# Patient Record
Sex: Female | Born: 1960 | Race: White | Hispanic: No | Smoking: Current every day smoker
Health system: Southern US, Community
[De-identification: ages and names within clinical notes are randomized; demographics above are authoritative.]

## PROBLEM LIST (undated history)

## (undated) DIAGNOSIS — I10 Essential (primary) hypertension: Secondary | ICD-10-CM

## (undated) DIAGNOSIS — I2510 Atherosclerotic heart disease of native coronary artery without angina pectoris: Secondary | ICD-10-CM

## (undated) DIAGNOSIS — F419 Anxiety disorder, unspecified: Secondary | ICD-10-CM

## (undated) DIAGNOSIS — I509 Heart failure, unspecified: Secondary | ICD-10-CM

## (undated) DIAGNOSIS — F319 Bipolar disorder, unspecified: Secondary | ICD-10-CM

## (undated) DIAGNOSIS — R011 Cardiac murmur, unspecified: Secondary | ICD-10-CM

## (undated) DIAGNOSIS — E7800 Pure hypercholesterolemia, unspecified: Secondary | ICD-10-CM

## (undated) HISTORY — PX: TUBAL LIGATION: SHX77

## (undated) HISTORY — PX: CARDIAC CATHETERIZATION: SHX172

---

## 1997-08-08 ENCOUNTER — Inpatient Hospital Stay (HOSPITAL_COMMUNITY): Admission: EM | Admit: 1997-08-08 | Discharge: 1997-08-09 | Payer: Self-pay | Admitting: Emergency Medicine

## 1998-09-28 ENCOUNTER — Emergency Department (HOSPITAL_COMMUNITY): Admission: EM | Admit: 1998-09-28 | Discharge: 1998-09-28 | Payer: Self-pay | Admitting: Emergency Medicine

## 1998-11-07 ENCOUNTER — Inpatient Hospital Stay (HOSPITAL_COMMUNITY): Admission: EM | Admit: 1998-11-07 | Discharge: 1998-11-09 | Payer: Self-pay | Admitting: Emergency Medicine

## 1998-12-07 ENCOUNTER — Inpatient Hospital Stay (HOSPITAL_COMMUNITY): Admission: EM | Admit: 1998-12-07 | Discharge: 1998-12-11 | Payer: Self-pay | Admitting: *Deleted

## 1999-04-17 ENCOUNTER — Other Ambulatory Visit: Admission: RE | Admit: 1999-04-17 | Discharge: 1999-04-17 | Payer: Self-pay | Admitting: Obstetrics and Gynecology

## 2000-09-01 ENCOUNTER — Encounter: Payer: Self-pay | Admitting: Emergency Medicine

## 2000-09-01 ENCOUNTER — Emergency Department (HOSPITAL_COMMUNITY): Admission: EM | Admit: 2000-09-01 | Discharge: 2000-09-01 | Payer: Self-pay | Admitting: Emergency Medicine

## 2000-09-04 ENCOUNTER — Emergency Department (HOSPITAL_COMMUNITY): Admission: EM | Admit: 2000-09-04 | Discharge: 2000-09-04 | Payer: Self-pay | Admitting: Emergency Medicine

## 2000-09-04 ENCOUNTER — Encounter: Payer: Self-pay | Admitting: Emergency Medicine

## 2000-09-26 ENCOUNTER — Other Ambulatory Visit: Admission: RE | Admit: 2000-09-26 | Discharge: 2000-09-26 | Payer: Self-pay | Admitting: Obstetrics and Gynecology

## 2001-10-27 ENCOUNTER — Other Ambulatory Visit: Admission: RE | Admit: 2001-10-27 | Discharge: 2001-10-27 | Payer: Self-pay | Admitting: Obstetrics and Gynecology

## 2001-11-10 ENCOUNTER — Ambulatory Visit (HOSPITAL_COMMUNITY): Admission: RE | Admit: 2001-11-10 | Discharge: 2001-11-10 | Payer: Self-pay | Admitting: Obstetrics and Gynecology

## 2001-11-10 ENCOUNTER — Encounter (INDEPENDENT_AMBULATORY_CARE_PROVIDER_SITE_OTHER): Payer: Self-pay | Admitting: Specialist

## 2002-11-23 ENCOUNTER — Inpatient Hospital Stay (HOSPITAL_COMMUNITY): Admission: RE | Admit: 2002-11-23 | Discharge: 2002-11-24 | Payer: Self-pay | Admitting: Obstetrics & Gynecology

## 2002-11-24 ENCOUNTER — Encounter: Payer: Self-pay | Admitting: *Deleted

## 2004-11-27 ENCOUNTER — Ambulatory Visit: Payer: Self-pay | Admitting: Cardiology

## 2005-11-22 ENCOUNTER — Ambulatory Visit: Payer: Self-pay | Admitting: Cardiology

## 2005-12-11 ENCOUNTER — Ambulatory Visit: Payer: Self-pay | Admitting: Cardiology

## 2007-02-12 ENCOUNTER — Inpatient Hospital Stay (HOSPITAL_COMMUNITY): Admission: EM | Admit: 2007-02-12 | Discharge: 2007-02-14 | Payer: Self-pay | Admitting: Emergency Medicine

## 2007-02-12 ENCOUNTER — Ambulatory Visit: Payer: Self-pay | Admitting: Cardiology

## 2007-02-13 ENCOUNTER — Encounter: Payer: Self-pay | Admitting: Cardiology

## 2007-02-13 ENCOUNTER — Ambulatory Visit: Payer: Self-pay | Admitting: Vascular Surgery

## 2007-02-23 ENCOUNTER — Ambulatory Visit: Payer: Self-pay | Admitting: Cardiovascular Disease

## 2007-03-02 ENCOUNTER — Ambulatory Visit: Payer: Self-pay | Admitting: Cardiovascular Disease

## 2007-03-02 ENCOUNTER — Ambulatory Visit: Payer: Self-pay | Admitting: Cardiology

## 2007-03-10 ENCOUNTER — Ambulatory Visit: Payer: Self-pay | Admitting: Cardiology

## 2007-03-12 ENCOUNTER — Ambulatory Visit: Payer: Self-pay | Admitting: Cardiovascular Disease

## 2007-03-20 ENCOUNTER — Ambulatory Visit: Payer: Self-pay | Admitting: Cardiovascular Disease

## 2008-04-08 DIAGNOSIS — I509 Heart failure, unspecified: Secondary | ICD-10-CM

## 2008-04-08 HISTORY — DX: Heart failure, unspecified: I50.9

## 2009-02-06 ENCOUNTER — Ambulatory Visit: Payer: Self-pay | Admitting: Cardiology

## 2010-08-21 NOTE — Assessment & Plan Note (Signed)
[facility]                          [facility] NOTE   NAME:[name], [name]                     MRN:          [id number]  DATE:03/10/2007                            DOB:          1960-11-24    CARDIOLOGIST:  Dr. Willa Rough.   PRIMARY CARE PHYSICIAN:  She has no primary care physician.   REASON FOR VISIT:  Post hospitalization followup.   HISTORY OF PRESENT ILLNESS:  [name] is a 50 year old female patient  who recently presented to [facility] with chest pain and near  syncope.  She underwent cardiac catheterization by Dr. Excell Seltzer which  revealed nonobstructive coronary disease.  She was noted to have a 50%  proximal LAD lesion that was quantitated at 46% by IVUS.  She also had a  30% proximal PDA lesion.  Her LV function was normal with an EF of 75-  80%.  She also had an echocardiogram that revealed an EF of 55-65%.  LV  wall thickness was moderately increased.  This was most prominent at the  apex with possible apical hypertrophic cardiomyopathy.  She had trivial  AI and mild MR and mildly pulmonary hypertension and a small pericardial  effusion.  She also underwent carotid Dopplers secondary to carotid  bruit, the carotid Dopplers were suggestive of left subclavian artery  stenosis.  She had a pressure gradient of 20 mmHg from the right to left  arm in her blood pressure.  She was enrolled in the [facility] study.  She  was also placed on Toprol and lisinopril for blood pressure, which had  been untreated, as well as aspirin.  She was subsequently discharged to  home.   Since being discharged from home the patient has followed up with Dr.  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  According to Dr. Earmon Phoenix  note, it is uncertain that her symptoms are secondary to left subclavian  steal.  The plan has been to set her up for a CT angiogram of her  aortic  arch to get a better anatomic assessment of the severity of stenosis.  The patient tells me that her left subclavian artery is 70% stenosed by  recent CT angiogram.  She is awaiting to hear back from Dr. Excell Seltzer for  further recommendations.   Since being discharged from the hospital the patient continues to have  intermittent chest pain. This is very atypical.  It is left-sided and  sharp.  It comes on at rest.  She does note it with exertion but  exertion does not make it any worse.  The pain is sometimes felt down  her left arm.  She denies associated shortness of breath, nausea or  diaphoresis.   She has also continued to have occasional dizziness.  She describes a  spinning sensation most of the time.  She was using her left arm at work  the other day.  She is a Conservation officer, nature.  With this, she became lightheaded and  felt near syncopal.  She denies any frank syncope.  She does admit to  a  spinning sensation with all of her episodes of near syncope.  She has  never been worked up for vertigo.   She continues to smoke cigarettes.  She describes dyspnea with exertion.  When questioned, she describes NYHA Class III symptoms although she does  not appear to be somebody who would have Class III symptoms.  She denies  orthopnea, PND or pedal edema.   The patient tells me today that she has also had episodes of dysphagia.  She sometimes feels as though she is going to strangle with all types of  food.  She denies odynophagia.  She has frequent dyspepsia.  She has  never been on a proton pump inhibitor.   CURRENT MEDICATIONS:  1. Toprol-XL 50 mg daily.  2. Lisinopril 20 mg daily.  3. Saturn research study drug.  4. Aspirin 81 mg daily.   ALLERGIES:  ERYTHROMYCIN.   PHYSICAL EXAM:  She is a well-nourished, well-developed female in no  distress.  Blood pressure is 126/89 in the right arm, pulse 62, weight  127.6 pounds.  HEENT:  Normal.  NECK:  Without JVD.  CARDIAC:  Normal S1  S2.  Regular rate and rhythm with a 1-2/6 systolic  ejection murmur heard best along the left lower sternal border.  LUNGS:  Clear to auscultation bilaterally without wheezing, rhonchi or  rales.  ABDOMEN:  Soft, nontender with normoactive bowel sounds.  No  organomegaly.  EXTREMITIES:  Without edema.  NEUROLOGIC:  She is alert and oriented x3.  Cranial nerves II-XII  grossly intact.  Right femoral arteriotomy site without hematoma or bruit.   Electrocardiogram reveals sinus bradycardia with a heart rate of 57,  significant LVH with repolarization abnormality.   IMPRESSION:  1. Nonobstructive coronary artery disease.  2. Good left ventricular function.  3. Hypertrophic nonobstructive cardiomyopathy.  4. Hypertension.  5. Treated dyslipidemia.      a.     Saturn study.  6. Left subclavian stenosis.      a.     Questionable history of left subclavian steal.  7. Near syncope.      a.     Question related to possible left subclavian steal.      b.     Questionable history of vertigo.  8. Gastroesophageal reflux disease.      a.     Dysphagia.  9. Ongoing tobacco abuse.      a.     Probable underlying chronic obstructive pulmonary disease.   PLAN:  Patient presents to the office today for post hospitalization  followup.  She continues to have episodes of near syncope.  She actually  describes symptoms that sound consistent with vertigo as well.  She has  also apparently been recently diagnosed with a 70% left subclavian  stenosis.  She also has significant symptoms that sound consistent with  GERD as well as dysphagia.  At this point in time will plan to:  1. Initiate Protonix 40 mg daily.  I question whether or not GERD may      be contributing to some of her chest pain.  2. Will refer her to Gastroenterology for further evaluation of GERD      and dysphagia.  3. I have recommended that she follow up with Dr. Excell Seltzer, as directed,      to further assess her left subclavian  stenosis.  4. In going forward, if her left subclavian stenosis is not felt to be      contributing to her  dizziness, she may need further evaluation for      benign positional vertigo.  We may also want to consider event      monitoring in the future as well.  In addition, she had more      prominent apical hypertrophy on her echocardiogram.  Dr. Jens Som,      who read the study, recommended an MRI if clinically indicated.  We      can also consider this in the future if thought to be necessary.  5. The patient will be brought back in followup with Dr. Myrtis Ser in the      next 8 weeks.  She knows to return sooner if needed.      Tereso Newcomer, PA-C  Electronically Signed      Learta Codding, MD,FACC  Electronically Signed   SW/MedQ  DD: 03/10/2007  DT: 03/10/2007  Job #: [id number]

## 2010-08-21 NOTE — Progress Notes (Signed)
[facility]                        PERIPHERAL VASCULAR OFFICE NOTE   NAME:[name], LYNDSAY TALAMANTE                     MRN:          [id number]  DATE:02/23/2007                            DOB:          17-Jul-1960    REASON FOR CONSULTATION:  Left subclavian stenosis with subclavian  steal.   REQUESTING PHYSICIAN:  Dr. Willa Rough   HISTORY OF PRESENT ILLNESS:  [name] is a delightful 50 year old  woman who was recently hospitalized with unstable angina.  She has had  two recent near syncopal episodes and during her hospitalization  underwent a carotid ultrasound to evaluate carotid bruits in the setting  of her recent near syncopal episodes.  Her vascular study demonstrated  abnormal brachial artery wave forms on the left and a significant 30 mm  pressure gradient from the right to the left arm with atypical left  vertebral artery flow, all suggestive of left subclavian stenosis.  She  was referred here for further evaluation of this problem.   [name] describes two near syncopal episodes. Both times she had  been standing and had not been doing any recent physical activities.  She had a prodrome of light headedness and tunnel vision and was able to  kneel to the ground and then felt better after just a few minutes. She  did not lose consciousness with either episode.  [name] also  complains of left arm pain and abnormal sensation at rest. She does not  have any exertional symptoms involving the left arm.  She specifically  denies pain, numbness or weakness of the left arm with physical  activity.  She has not had any exertional syncope.   PAST MEDICAL HISTORY IS PERTINENT FOR THE FOLLOWING:  1. Coronary artery disease. She recently underwent cardiac      catheterization for evaluation of chest pain that demonstrated      nonobstructive CAD. There was a 50% proximal LAD stenosis and a      moderate stenosis in the right PDA, the left  circumflex appeared      normal and left ventricular function was hyperdynamic.  2. Severe hypertension.  Bilateral renal arteries were widely patent      at the time of cardiac catheterization.  3. Tobacco abuse.  4. Chronic low back pain.   CURRENT MEDICATIONS:  1. Metoprolol succinate 50 mg daily.  2. Lisinopril 20 mg daily.  3. Aspirin 81 mg daily.  4. Saturn study drug.   ALLERGIES:  No known drug allergies.   FAMILY HISTORY:  The patient's mother is alive at age 5 and has  coronary artery disease and cerebrovascular disease, she has 5 siblings  with hypertension. Her father died at age 69 of a myocardial infarction.   SOCIAL HISTORY:  The patient lives in [place]. She is single. She is a  one pack per day smoker for the past 30 years, she has recently cut back  on cigarettes. She does not use alcohol or illicit drugs.   REVIEW OF SYSTEMS:  A complete 12 point review of systems was performed.  All systems were negative except for findings  as noted above.   PHYSICAL EXAMINATION:  GENERAL:  The patient is alert and oriented, she  is in no acute distress.  VITAL SIGNS:  Her weight is 129, blood pressure is 150/80 on the right,  120/90 on the left, heart rate 64, respirations 16.  HEENT:  Normal.  NECK:  There is no thyromegaly or thyroid nodules. Jugular venous  pressures normal. Carotid upstrokes are 2+, there is a loud left carotid  bruit and a quiet right carotid bruit.  LUNGS:  Clear to auscultation bilaterally.  CARDIOVASCULAR:  The apex is discreet and nondisplaced, there is no  right ventricular heave or lift. Regular rate and rhythm without murmurs  or gallops.  There is a 2/6 bruit over the left subclavian region.  ABDOMEN:  Soft, nontender, no abdominal bruits, no organomegaly.  BACK:  There is no CVA tenderness.  EXTREMITIES:  No clubbing, cyanosis or edema.  Peripheral pulses are 2+  and equal throughout.  The left brachial and radial pulses are slightly   diminished compared to the right but they are still 2+.  SKIN:  No rash.  LYMPHATICS:  No adenopathy.  NEUROLOGIC:  Cranial nerves II-XII are intact.  Strength is 5/5 and  equal in the arms and legs.   ASSESSMENT:  [name] has evidence of left subclavian stenosis.  The  big question is whether she has any symptoms due to this. I think her  symptoms are somewhat atypical as she does not have any left arm  claudication.  Her left arm pain is clearly not related to vascular  insufficiency as it is constant and most notable at rest.  She has had  two near syncopal episodes but neither was associated with use of the  left arm.  It is very difficult to know whether this is an incidental  finding or whether it truly could be related to her near syncopal  episodes.  Of note, she did have atypical flow in the left vertebral  artery.  I think it would be beneficial to perform a CT angiogram of the  aortic arch and left subclavian region to get a better anatomic  assessment of the severity of stenosis.  I plan on following up with Ms.  [name] after the results of this are available.  In the meantime she is  on an excellent medical program for her coronary and peripheral arterial  disease.  She will continue to follow with Dr. Myrtis Ser for her cardiac  disease.    Veverly Fells. Excell Seltzer, MD  Electronically Signed   MDC/MedQ  DD: 02/23/2007  DT: 02/23/2007  Job #: [id number]   cc:   [name], ANP

## 2010-08-21 NOTE — Discharge Summary (Signed)
[name], [name] NO.:  [id number]   MEDICAL RECORD NO.:  [id number]          PATIENT TYPE:  INP   LOCATION:  3733                         FACILITY:  [facility]   PHYSICIAN:  [name] Rotunda, MD, FACCDATE OF BIRTH:  27-Aug-1960   DATE OF ADMISSION:  02/12/2007  DATE OF DISCHARGE:  02/14/2007                               DISCHARGE SUMMARY   PRIMARY CARDIOLOGIST:  [name] Abed, MD, [facility]   PRIMARY CARE [name]:  The patient does not have a primary care  [name].   DISCHARGE DIAGNOSIS:  Chest pain.   SECONDARY DIAGNOSES:  1. Presyncope.  2. Hypertension.  3. ? Left arm claudication.  4. Presumed subclavian artery stenosis.  5. Ongoing tobacco abuse.  6. Chronic low back pain.  7. Hyperlipidemia.  8. Nonobstructive coronary artery disease.  9. Mild mitral regurgitation.  10.Normal left ventricular function with ejection fraction of 55% to      65% by 2D echocardiogram on this admission.   HISTORY OF PRESENT ILLNESS:  A 50 year old Caucasian female with a prior  history of recurrent chest pain with normal Cardiolite in 2007.  She  presented to the [facility] ED on February 12, 2007, with complaints of  chest pain and presyncope and was admitted for evaluation.   HOSPITAL COURSE:  The patient ruled out for MI.  A 2D echocardiogram was  performed on February 13, 2007, revealing normal LV function with mild MR  as outlined above.  She underwent left heart cardiac catheterization on  February 13, 2007, revealing a 50% stenosis in the proximal LAD and a 30%  stenosis in the distal PDA.  There were no targets for intervention.  She had widely patent renal arteries and a hyperdynamic LV function with  an EF of 75%.   [name] was also evaluated with bilateral carotid ultrasound which  showed no significant internal carotid artery stenosis.  It was noted,  however, that there was an atypical waveform of the left vertebral  artery  along with an abnormal left  brachial waveform suggestive of  proximal subclavian artery stenosis.  She was noted to have a loud bruit  noted just under the left clavicle.  When questioned, she notes that her  left arm never feels normal and feels different than the right arm,  but she denies any specific left arm claudication with activities.  We  have arranged for her to follow up with Dr. Tonny [name] in peripheral  vascular clinic for further evaluation.  Of note, she does have a  gradient of approximately 20 mmHg from the right to the left arm blood  pressure.   [name] is otherwise doing well without recurrent symptoms.  She is  being discharged home today in satisfactory condition.   DISCHARGE LABS:  Hemoglobin 10.9, hematocrit 31.8, WBC 6.5, platelets  177,000.  INR 1.  Sodium 140, potassium 4, chloride 107, CO2 of 27, BUN  13, creatinine 0.87, glucose 87, total bilirubin 0.6, alkaline  phosphatase 60, AST 25, ALT 18, total protein 5.9, albumin 3.2, calcium  8.3, magnesium 2.  CK 127, MB 3.1, troponin  I 0.03.  Total cholesterol  207, triglycerides 145, HDL 27, LDL 151.  TSH 1.445.  Urine drug screen  was negative.  Urinalysis was negative.   DISPOSITION:  The patient is being discharged home today in good  condition.   FOLLOWUP PLANS AND APPOINTMENTS:  We will arrange for followup with Dr.  Willa [name] in our [place] office in approximately 2 weeks.  Will also  arrange followup with Dr. Excell [name] in peripheral vascular clinic in our  [place] office in 2-3 weeks.  She is recommended to obtain a primary  care [name].   DISCHARGE MEDICATIONS:  1. Toprol XL 50 mg daily.  2. Lisinopril 20 mg daily.  3. Aspirin 81 mg daily.  4. Saturn study drug (atorvastatin versus rosuvastatin) daily.   PENDING LAB STUDIES:  None.   DURATION OF DISCHARGE ENCOUNTER:  Forty-five minutes including physician  time.      [name], ANP      [name] Rotunda, MD, [facility]  Electronically Signed     CB/MEDQ  D:  02/14/2007  T:  02/14/2007  Job:  [id number]

## 2010-08-21 NOTE — Progress Notes (Signed)
[place] [facility]                        PERIPHERAL VASCULAR OFFICE NOTE   NAME:[name], [name]                     MRN:          [id number]  DATE:03/20/2007                            DOB:          December 06, 1960    [name] returns for a followup at the [facility] Peripheral [facility]  office on March 20, 2007.  [name] is a 50 year old woman known  to me from previous hospitalization a few months ago at [facility] for chest pain.  I was asked to evaluate her for left  subclavian stenosis following her hospitalization.  [name] has  undergone a carotid ultrasound to evaluate carotid bruit and this  demonstrated abnormal wave forms in the left arm with atypical flow in  the left vertebral artery, both of which raise suspicion of left  subclavian artery stenosis.  After her visit, she underwent a CT  angiogram of the head and neck vessels that demonstrated a 70% left  subclavian artery stenosis, proximal to the left vertebral artery.  She  returns today for followup of this problem.   She continues to complain of dizziness. Her dizziness is near constant  and truly is a spinning sensation rather than a lightheaded or near  syncopal spell.  She has noted worsening of symptoms with position  changes as well as reaching over her head.  She denies any arm  claudication symptoms and has no problem with the use of her left arm.  She has not had near syncope or syncope with the use of the left arm.   CURRENT MEDICATIONS:  1. Metoprolol succinate 50 mg daily.  2. Lisinopril 20 mg daily.  3. Aspirin 81 mg daily.  4. Saturn study drug.   PHYSICAL EXAMINATION:  GENERAL:  The patient is alert and oriented.  She  is in no acute distress.  VITAL SIGNS:  Blood pressure on my check is 170/90 in the right arm and  130/90 in the left arm.  Heart rate is 86, respiratory rate is 16.  HEENT:  Normal.  NECK:  Normal carotid upstrokes with bilateral  bruits, left greater than  right.  LUNGS:  Clear to auscultation bilaterally.  HEART:  Regular rate and rhythm with a 2/6 systolic ejection murmur  along the left sternal border and a bruit under the left clavicle as  well.  There are no diastolic murmurs.  ABDOMEN:  Soft, nontender.  No organomegaly.  BACK:  There is no CVA tenderness.  EXTREMITIES:  No clubbing, cyanosis, or edema.  Left arm pulses are 1  plus.  Right arm pulses are 2 plus.  Femoral pulses are 2 plus and  equal.   EKG shows a normal sinus rhythm with marked left ventricular hypertrophy  and repolarization abnormality.   ASSESSMENT:  This is a 50 year old woman with left subclavian artery  stenosis.   I have reviewed her CT angiogram.  Certainly, the left subclavian  stenosis is amenable to endovascular treatment with stenting.  It is  proximal to the left vertebral artery and therefore would be technically  approachable with stenting.  However, [name]' symptoms  are atypical  for subclavian steal.  I really believe they are more consistent with  vertigo or inner ear dysfunction.  I have requested an ENT consultation  in [place], which she prefers.  She is being referred to Dr. Katrinka Blazing.  I have  also given her a prescription for meclizine 25 mg t.i.d. for dizziness.  I am going to follow her up closely in 4-6- weeks and if things have not  resolved then I will strongly consider intervention of her left  subclavian stenosis.   In the meantime, she will continue on her current medical therapy for  her coronary and peripheral vascular disease.     Veverly Fells. Excell Seltzer, MD  Electronically Signed    MDC/MedQ  DD: 03/20/2007  DT: 03/20/2007  Job #: [id number]   cc:   Luis Abed, MD, [facility]

## 2010-08-21 NOTE — Cardiovascular Report (Signed)
[name]              ACCOUNT NO.:  [id number]   MEDICAL RECORD NO.:  [id number]          PATIENT TYPE:  INP   LOCATION:  3733                         FACILITY:  [facility]   PHYSICIAN:  Veverly Fells. Excell Seltzer, MD  DATE OF BIRTH:  [birth year]   DATE OF PROCEDURE:  02/13/2007  DATE OF DISCHARGE:                            CARDIAC CATHETERIZATION   PROCEDURE:  Left heart catheterization, selective coronary angiography,  left ventricular angiography, abdominal aortic angiography,  intravascular ultrasound of the left anterior descending artery,  intravascular ultrasound of the left circumflex, Starclose of the right  femoral artery.   INDICATIONS:  [name] is a 50 year old woman with multiple  cardiovascular risk factors. She presented with typical chest pain. She  has had long-standing chest pain and has had an abnormal Myoview. Her  chest pain has been progressive, leading to her hospitalization, and she  was referred for cardiac catheterization. She also has an abnormal EKG  with changes of left ventricular hypertrophy and repolarization changes.  Her echocardiogram shows left ventricular hypertrophy as well. In the  setting of severe hypertension, I elected to perform abdominal  angiography to assess for renal artery stenosis.   Risks and indications of the procedure were reviewed with the patient.  Informed consent was obtained. The right groin was prepped, draped and  anesthetized with 1% lidocaine using modified Seldinger technique. A 6-  French sheath was placed in the right femoral artery. Standard 6-French  Judkins catheters were used. For the left coronary artery, I used a  JL3.5 catheter. Following selective coronary angiography, an angled  pigtail catheter was inserted into the left ventricle. Markedly elevated  L5 diastolic pressures were recorded, and I elected to do just a hand  injection for the ventriculogram to minimize contrast in the left  ventricle.  Pullback across the aortic valve was done. The patient had  moderate disease in the proximal LAD that angiographically appeared to  be 50%. I elected to perform intravascular ultrasound of that vessel  based on clinical factors as it was a moderate lesion, and with her  typical pain, I wanted to make sure that it did not appear  hemodynamically significant. The patient was also enrolled in the [employer]  trial, so I performed intravascular ultrasound of her left circumflex as  well to evaluate that vessel.   Fifty units per kg of heparin was given. Once a therapeutic ACT was  achieved, a Q 3.5-mm guide catheter was inserted. Intracoronary  nitroglycerin was given. A Cougar guide wire was passed down into the  distal LAD, and intravascular ultrasound was performed. Following IVUS  of the LAD, the Cougar wire was pulled back and passed down the left  circumflex. IVUS of the left circumflex was performed using automatic  pullback. At the completion of the procedure, the guide catheter was  removed. The angled pigtail catheter was then placed in the abdominal  aorta, and an abdominal aortogram was performed using a power injection.  At the completion of the procedure, a Starclose device was used to seal  the femoral arteriotomy.   FINDINGS:  Aortic pressure 161/82 with  a mean of 115, left ventricular  pressure 158/34.   CORONARY ANGIOGRAPHY:  Left mainstem is angiographically normal. It  bifurcates into the LAD and left circumflex.   The LAD is a large caliber vessels that courses down and wraps around  the left ventricular apex. There are 2 moderate- to large-sized diagonal  branches present. The proximal LAD has a focal area of moderate stenosis  that angiographically appears 50%. The area of the stenosis is just  before the first diagonal. The remaining portions of mid and distal LAD  have no significant angiographic stenosis.   The left circumflex is of medium caliber. It is a  relatively tortuous  vessel. It courses down and supplies a medium-sized obtuse marginal  branch. The AV groove circumflex is medium in caliber and supplies a  small posterolateral branch.   The right coronary artery is dominant. It is a large vessel. The  proximal mid and distal portions of the right coronary artery have no  significant angiographic stenosis. The right coronary artery bifurcates  into the PDA and a right posterolateral branch. Posterolateral is  relatively small. The PDA is a large vessel. The proximal portion of the  PDA has a 30% stenosis.   Left ventricular function appears hyperdynamic. The LV cavity  obliterates in systole. The LV EF is estimated at 75-80%.   Abdominal aortography demonstrates only minor plaque in the abdominal  aorta with no evidence of focal dilatation or aneurysm. There are widely  patent renal arteries bilaterally.   INTRAVASCULAR ULTRASOUND:  IVUS of the LAD demonstrates diffuse  nonobstructive plaque throughout. The area of focal stenosis has a  minimal lumen area of 3.5 mm2. Compared to the reference vessel, this  gives a 46% stenosis.   Left circumflex IVUS demonstrates essentially a normal vessel. Pullback  into the left main shows mildly eccentric plaque in the left mainstem  that is nonobstructive.   ASSESSMENT:  1. 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  2. Moderate right posterior descending coronary artery stenosis.  3. Normal left circumflex.  4. Hypertrophy, hyperdynamic left ventricle.  5. Patent bilateral renal arteries.  6. Elevated left ventricular end-diastolic pressure.   DISCUSSION:  Would recommend aggressive medical therapy for secondary  risk reduction. I think the LAD lesion can be treated medically and does  not warrant intervention at this time. [name] has evidence of  hypertensive heart disease and will  benefit from aggressive blood  pressure lowering as well. She should be eligible for discharge on  November 8.      Veverly Fells. Excell Seltzer, MD  Electronically Signed     MDC/MEDQ  D:  02/14/2007  T:  02/14/2007  Job:  [id number]

## 2010-08-21 NOTE — H&P (Signed)
NAMEKENZLIE, DISCH NO.:  [id number]   MEDICAL RECORD NO.:  [id number]          PATIENT TYPE:  INP   LOCATION:  3733                         FACILITY:  [facility]   PHYSICIAN:  Luis Abed, MD, FACCDATE OF BIRTH:  1960-04-13   DATE OF ADMISSION:  02/12/2007  DATE OF DISCHARGE:                              HISTORY & PHYSICAL   HISTORY OF PRESENT ILLNESS:  [name] is a 50 year old woman, with a  past medical history significant for hypertension and recurrent chest  pain of uncertain etiology.  She has been seen both at [facility] and [facility] on various occasions.  She has had, in  the past, a negative Cardiolite in 2004 and and an echocardiogram in  2004 that showed an ejection fraction of 65-70%.  She has had multiple  admissions for recurrent chest pain and then in 2007, had a positive  Cardiolite done at [facility], which showed some possible  anteroapical ischemia.  She has had very poor follow up in terms of her  symptoms and cardiology work up.  She says this is mostly because she  has not had health insurance in the past and has not had a primary care  Brad Mcgaughy.   Her presentation today was prompted by series of events over the past 2  weeks that include near syncope, hypertension, and chest pain. She says  that she has chronic chest pain with exertion or stress.  When the pain  comes on, she says it lasts about 30-60 minutes, is retrosternal,  sometimes radiates up into the left side of her neck.  The chest pain is  also associated with a pounding feeling in her head.  She reports in  the last 2 weeks, episodes of near syncope, where she felt very weak,  nauseated, and diaphoretic.  These episodes were associated with  dizziness with a sensation of the room spinning around her.  They all  resolved with rest.  She has not been evaluated for this symptom in the  past.  Incidentally, she notes that she has periodically had a  friend of  hers, who is an EMT, to check her blood pressure and when she is having  symptoms, she has found her blood pressure to be in the systolic range  of 220, although she does not know the exact reading.  In our records  and in previous evaluations, she has not been hypertensive, which  suggests a possible secondary source of hypertension.  Currently, she  has no chest pain.  She is not complaining of any shortness of breath.  She is recovering from an upper respiratory viral infection, which she  said has lasted for about 1 week and was characterized by cough, nasal  congestion, and sore throat.   PAST MEDICAL HISTORY:  1. Hypertension.  2. History of recurrent chest pain.  Cardiolite negative in 2004, 2D      echo showed an ejection fraction in 2004 of 65-70%, Cardiolite in      2007 at [facility] showed intra apical ischemia.  3. Tobacco abuse.  4. Question  of mitral regurgitation on 2D echo.  5. Chronic low back pain.  6. Recent history of viral upper respiratory tract infection.  7. Hospitalized 2 weeks ago at [facility] for similar      complaints, was released for outpatient follow up.   REVIEW OF SYSTEMS:  [name] denies any changes in her vision,  headaches, or dysarthria or loss of consciousness.  She does complain of  some left facial sensation changes, which is a mild numbness on the left  lower part of her face.  She denies any pleuritic chest pain.  She  denies any chest trauma.  She denies any abdominal pain or extremity  weakness.   ALLERGIES:  ERYTHROMYCIN, WHICH GIVES HER A RASH.   HOME MEDICATIONS:  1. Aspirin one 325 mg tablet daily.  2. Metoprolol.  3. Lisinopril.  4. Coricidin HBP, taken for the last 2 weeks.   Patient does not know the exact doses of her medications.   FAMILY HISTORY:  Her mother is alive at 65 years of age with  debilitating health problems, including cerebrovascular disease, stroke  and unfortunately had an  acute MI last week and was hospitalized here at  [facility].  She has 5 siblings, all with hypertension.  Her father is  deceased at the age of 64 with heart attack.   SOCIAL HISTORY:  Positive for tobacco abuse, 1 pack per day for 30  years.  [name] is single.  [place]  She denies the use of any alcohol or illicit drugs.   Vital signs:  Blood pressure 109/57, heart rate 64, SPO2 is 99% on room  air, respiratory rate 18, temperature 97.7.   PHYSICAL EXAMINATION:  GENERAL:  [name] is alert, oriented, in no  acute distress.  HEENT:  Pupils equal, round and reactive to light.  Extraocular muscles  intact.  Oropharynx is clear.  CARDIOVASCULAR:  Regular rate and rhythm, 2/6 harsh systolic ejection  murmur.  LUNGS:  Lungs are clear bilaterally with good air movement.  There are  some soft diffuse wheezes heard bilaterally.  ABDOMEN:  Soft, nontender.  EXTREMITIES:  No edema.  Pulses symmetric.  NEURO:  Cranial nerves 2-12 are intact, there are no motor or sensory  deficits.  PSYCH:  Appropriate.   LABORATORY DATA:  WBC 4.5, hemoglobin 12.8, hematocrit 37.5, platelets  213.  Sodium 139, potassium 4.6, chloride 107, bicarb 29.6, BUN 13,  creatinine 1.0, glucose 85.  First set of cardiac enzymes:  Troponin  less than 0.05, myoglobin 116, CKMB 2.1.   EKG:  Normal sinus rhythm with left ventricular hypertrophy with a  strain pattern.   Chest x-ray:  Cardiomegaly, no pulmonary edema.   IMPRESSION:  1. Hypertension: [name] has evidence of chronic longterm      hypertension, based on a severe left ventricular hypertrophy strain      pattern on her EKG and cardiomegaly on her chest x-ray.  There is      no 2D echo here at [facility] to review.  Those records should be      requested from [facility].  She has no ischemic changes on      her EKG.  ST changes are all consistent with a strain pattern not      ischemia or infarction.   Given her age and advanced disease, we      cannot exclude other causes of secondary hypertension in our  differential diagnosis, which includes pheochromocytoma, renal      artery stenosis, or renovascular cause of hypertension. She has had      poor follow up in the past, as well as non-adherence, but the      findings are still markedly severe with her current presentation.      It is interesting her blood pressure is in a low normal range now      and it seems that she has had some readings taken by healthcare      professional that are much higher than this, suggesting possibly an      intermittent hypertension that occurs several times in a week and      is not sustained.  2. Chest Pain: Recurrent chest pain in the setting of the above      discussed left ventricular hypertrophy.  At this point, we cannot      exclude or rule out significant obstructive coronary disease,      unless we obtain cardiac catheterization.  It seems that caths have      been scheduled in the past, but have been lost to follow through.  3. Pre-syncope:  Likely her description of near syncopal episodes are      likely multi-factorial, but her history is concerning for      theochromocytoma or renovascular disease, which could definitely      lead to syncopal type presentation with bradyarrhythmias and other      manifestations of hypoperfusion states.  This has not been      documented in her previous records.  4. Tobacco abuse:  Significant risk factor, given her presentation,      significant contributory factor in her presentation.   PLAN:  1. We will admit Ms. [name] to telemetry unit, where she will be      monitored.  We will cycle her cardiac enzymes and obtain serial      EKG's for an acute MI or rule out and work up.  2. She will probably be added onto the cardiac cath schedule, so we      will make her NPO after midnight.  3. Obtain Carotid Dopplers, Renal artery dopplers, and a 2D echo  of      her heart.  4. Follow the telemetry closely for events overnight.  5. Continue her beta blocker and ACE inhibitor for now.  I doubt these      are contributory in her pre syncope.  We will give some IV fluids      since she will be NPO.  6. There is no evidence of acute MI, we will continue the standing      chest pain orders with p.r.n. nitroglycerin and heparin.      Edsel Petrin, D.O.  Electronically Signed      Luis Abed, MD, [facility]  Electronically Signed    ELG/MEDQ  D:  02/12/2007  T:  02/13/2007  Job:  [id number]

## 2010-08-24 NOTE — Consult Note (Signed)
[name], [name]                        ACCOUNT NO.:  [id number]   MEDICAL RECORD NO.:  [id number]                   PATIENT TYPE:  INP   LOCATION:  A202                                 FACILITY:  [facility]   PHYSICIAN:  [name], M.D.                DATE OF BIRTH:  05/22/1960   DATE OF CONSULTATION:  11/23/2002  DATE OF DISCHARGE:                                   CONSULTATION   CARDIOLOGY CONSULTATION:   REFERRING PHYSICIAN:  Dr. Turner [name]   PRIMARY CARE [name]:  She has no primary care [name].   HISTORY OF PRESENT ILLNESS:  [name] is a 50 year old female who is  preop for a total vaginal hysterectomy who essentially presented for the  surgery and was in the midst of being induced for her anesthesia when EKG  changes were noted on the monitor which were concerning to the  anesthesiologist and so I was asked to see the patient emergently in the  PACU.  She is mildly sedated but without any significant chest discomfort or  shortness of breath and really has no symptoms whatsoever related to her  heart, no PND, no orthopnea, no chest discomfort.  She is not particularly  active but states that when she was previously active she had no problems  with chest discomfort.  Her cardiac risk factors are only significant for  tobacco abuse, we do not know her cholesterol status, she is not diabetic,  it appears that she is hypertensive but she has not previously carried the  diagnosis of hypertension and she has no family history of coronary artery  disease although a family history of emphysema and diabetes mellitus.   PAST MEDICAL HISTORY:  Negative.   PAST SURGICAL HISTORY:  She has had a tubal ligation and cryoablation last  year, both of which were without complication from the anesthetic.  She has  had two spontaneous vaginal deliveries of two healthy children.   ALLERGIES:  She is allergic to ERYTHROMYCIN (she gets a rash).   MEDICATIONS:  She does not take  any medications.   REVIEW OF SYSTEMS:  Her review of systems other than that reviewed in the  history of present illness is negative other than her GYN history which is  well documented in her history and physical.   SOCIAL HISTORY:  She smokes about a half pack of cigarettes and has for the  last 25 years.  She does not drink alcohol and does not do drugs.  She is  currently unemployed but previously worked as a Catering manager for a tax group.   FAMILY HISTORY:  Her father died of emphysema in 17.  Her mother has  hypertension and diabetes but no coronary artery disease.  She has four  sisters - two of whom are older than her, two of whom that are younger than  her one of whom has diabetes.  Her brother had  hypertension and diabetes.  Her two children are healthy.   PHYSICAL EXAMINATION:  GENERAL:  She is a well-developed, well-nourished  white female who is in no apparent distress alert and oriented x4 though  mildly sedated.  VITAL SIGNS:  Her blood pressure is 163/87, her heart rate is 95 in sinus,  respiratory rate is 14, she is afebrile.  HEAD/EARS/EYES/NOSE AND THROAT:  Unremarkable.  NECK:  Supple.  There is no jugular venous distention or carotid bruits.  Her thyroid is normal size and in the midline.  CHEST:  Clear to auscultation.  CARDIAC:  Nondisplaced point of maximal impulse with no lifts or thrills.  First and second heart sounds are normal.  There is a 2/6 holosystolic  murmur heard best in the lower left sternal border which does not radiate.  She has no diastolic murmurs.  ABDOMEN:  Soft, nontender, normoactive bowel sounds.  BREASTS/GU AND RECTAL:  Exams are all deferred though documented in her  history and physical exam.  EXTREMITIES:  Lower extremities are without significant clubbing, cyanosis,  or edema.  There is normal pulses throughout.  NEUROLOGICAL:  Grossly normal.   ELECTROCARDIOGRAM:  Her electrocardiogram is very interesting.  She is sinus  rhythm  with voltage criteria for left ventricular hypertrophy and ST segment  depression throughout her precordium which appears to be consistent with  repolarization abnormality.  She has mild left atrial enlargement but a  normal axis.   LABORATORY EXAMINATION:  Her white blood cell count is 5.6, H&H of 12.6 and  37 with a platelet count of 190,000.  Sodium is 141, potassium 4.3, chloride  of 105, bicarbonate of 30, glucose of 83 with a BUN of 7, creatinine of 0.9.  Her liver function studies are all within normal limits.  Her beta hCG is  negative.  Urinalysis is unremarkable except for many squamous cells, a few  white blood cells and a few red blood cells and few bacteria.  She is A  positive blood type.   ASSESSMENT:  This is a woman with perioperative EKG changes which are of  uncertain etiology.  I am wondering if her baseline EKG was normal.  She did  have an EKG previously at the walk-in clinic in [place] and we are trying  to obtain that EKG.  The EKG changes are not particularly consistent with  ischemia and a bedside echocardiogram reveals normal left ventricular  function so without chest pain or wall motion abnormalities and with very  likely left ventricular hypertrophy I suspect that is what these EKG changes  are from.  She does have mild to moderate hypertension which is newly  discovered with left ventricular hypertrophy on an EKG and we will need to  be aggressive in treating that and she has mild mitral regurgitation on  echocardiogram.   PLAN:  My plan is to cycle her enzymes, get an echocardiogram which I will  fully interrogate here shortly, if she rules out for myocardial infarction  we will get an exercise Cardiolite tomorrow, if that is normal then she can  proceed with her surgery.  I am going to treat her hypertension initially with Lopressor and we may have to add a second agent if her blood pressure  does not come into reasonable control.  [name], M.D.    JH/MEDQ  D:  11/23/2002  T:  11/23/2002  Job:  [id number]   cc:   [name], M.D.  [address], [address]  [place]  [place] [address]  Fax: [phone]

## 2010-08-24 NOTE — Discharge Summary (Signed)
   [name], [name]                        ACCOUNT NO.:  [id number]   MEDICAL RECORD NO.:  [id number]                   PATIENT TYPE:  INP   LOCATION:  A202                                 FACILITY:  [facility]   PHYSICIAN:  Hanley Hays. Dechurch, M.D.           DATE OF BIRTH:  1960-10-21   DATE OF ADMISSION:  11/23/2002  DATE OF DISCHARGE:  11/24/2002                                 DISCHARGE SUMMARY   DIAGNOSES:  1. Hypertension.  2. Abnormal electrocardiogram with Cardiolite negative for ischemia,     negative cardiac enzymes, probable apical hypertrophy.  3. Tobacco abuse.  4. Hyperlipidemia; cholesterol 207, triglycerides 340, HDL 38, LDL 101.   HOSPITAL COURSE:  The patient is a 50 year old Caucasian female who was  admitted for planned vaginal hysterectomy and bladder suspension procedure  by Dr. Turner Daniels.  During induction she was noted to be hypertensive and on  her EKG tracings had marked ST depression.  Surgery was cancelled and  cardiology consultation was obtained.  The patient was admitted to the  telemetry floor.  Echocardiogram revealed normal size LV with mild  concentric left ventricular hypertrophy.  Cardiac enzymes remained normal.  There was question of some mild diastolic function impairment on the  echocardiogram.  The patient was noted to be hypertensive with systolics  ranging in the high 140s to 160s, diastolics in the 90 to 100 range.  She  was placed empirically on Lopressor.  Cardiolite study was obtained the  following day which revealed no ischemic changes.  Exercise tolerance was  fair.  There was some diminution at the apex raising the question of apical  hypertrophy which could help to account for her grossly abnormal EKG.  Old  records were obtained from 2002.  The patient had had an echocardiogram at  that time for the EKG with similar findings as well as an electrocardiogram  which was identical.  She was stable for discharge.  Surgery will be  rescheduled.  She is being discharged to home on Lopressor 50 mg b.i.d. and  to follow up with her primary care providers for monitoring of her  hypertension, and Dr. Despina Hidden on Friday.                                               Hanley Hays Dechurch, M.D.    FED/MEDQ  D:  11/25/2002  T:  11/25/2002  Job:  [id number]   cc:   phone number [phone] Dictator to call later with specifics

## 2010-08-24 NOTE — Discharge Summary (Signed)
   [name], [name]                        ACCOUNT NO.:  [id number]   MEDICAL RECORD NO.:  [id number]                   PATIENT TYPE:  INP   LOCATION:  A202                                 FACILITY:  [facility]   PHYSICIAN:  Lazaro Arms, M.D.                DATE OF BIRTH:  1960-11-16   DATE OF ADMISSION:  11/23/2002  DATE OF DISCHARGE:  11/24/2002                                 DISCHARGE SUMMARY   DISCHARGE DIAGNOSES:  1. Untreated hypertension.  2. Repolarization causing EKG changes perioperatively.   PROCEDURES:  1. Admission to the hospital.  2. Evaluation of EKG changes.   Please refer to the transcribed History and Physical for details of the  admission to the hospital.   HOSPITAL COURSE:  The patient was admitted to undergo surgery.  She was  getting ready to have her anesthesia induced when she had significant EKG  changes.  We did not proceed at that point, felt like it was repolarization  due to untreated hypertension, and she was admitted to the ICU by the  hospitalists and the cardiologists for evaluation - all of which turned out  negative.  She was placed on antihypertensive medication and discharged home  to follow up in the office the next week for rescheduling of her surgery.     ___________________________________________                                         Lazaro Arms, M.D.   [name]  D:  02/03/2003  T:  02/03/2003  Job:  [id number]

## 2010-08-24 NOTE — Op Note (Signed)
[name], [name]                        ACCOUNT NO.:  [id number]   MEDICAL RECORD NO.:  [id number]                   PATIENT TYPE:  AMB   LOCATION:  SDC                                  FACILITY:  [facility]   PHYSICIAN:  Miguel Aschoff, M.D.                    DATE OF BIRTH:  1960/07/23   DATE OF PROCEDURE:  11/10/2001  DATE OF DISCHARGE:                                 OPERATIVE REPORT   PREOPERATIVE DIAGNOSES:  Menorrhagia.   POSTOPERATIVE DIAGNOSES:  Menorrhagia.   PROCEDURE:  Cervical dilatation and curettage, endometrial cryo ablation.   SURGEON:  Miguel Aschoff, M.D.   ANESTHESIA:  Spinal.   COMPLICATIONS:  None.   JUSTIFICATION:  The patient is a 50 year old white female with history of  heavy menses that has not responded to outpatient therapy with nonsteroidal  anti-inflammatory agents and cyclic progestational agents.  She presents now  to undergo cervical dilatation, curettage, and endometrial cryo ablation to  see if these menstrual irregularities can be corrected.  Risks and benefits  were discussed with the patient.   PROCEDURE:  The patient was taken to the operating room, placed in a sitting  position, and spinal anesthesia was administered without difficulty.  After  a satisfactory level of spinal anesthesia was achieved the patient was  placed in the dorsal lithotomy, prepped and draped in the usual sterile  fashion.  Bladder was catheterized.  Examination revealed the uterus to be  anterior, globular, top normal in size.  No adnexal masses were noted.  Speculum was placed in the vaginal vault.  The anterior cervical lip was  grasped with a tenaculum and endocervical canal was dilated until a 25 Pratt  dilator could be passed.  The medium sized curette was then used and the  cavity was systematically curetted and the tissue sent for histologic study.  The cavity was smooth and regular.  Following this, the cryo ablation unit  was used and the endometrial cavity  underwent cryo ablation by placing the  probe in the left cornual region for six minutes, the right cornual region  for six minutes, and then mid position in the fundus for six minutes.  This  was done without difficulty.  The procedure was completed.  There was  minimal blood loss.  After the procedure was completed, the patient was  taken to the operating room in satisfactory condition.  The plan is for the  patient to be discharged home.  She will be seen back in four weeks for  follow-up examination.   DISCHARGE MEDICATIONS:  1. Doxycycline 100 mg b.i.d. x3 days.  2. Darvocet-N 100 one q.4-6h. as needed for pain.    DISCHARGE INSTRUCTIONS:  She is to call for any problems such as fever,  pain, or heavy bleeding.   FINAL DIAGNOSES:  Menorrhagia.  Miguel Aschoff, M.D.    AR/MEDQ  D:  11/10/2001  T:  11/13/2001  Job:  [id number]

## 2010-08-24 NOTE — Assessment & Plan Note (Signed)
[facility]                            [facility] OFFICE NOTE   NAME:[name], [name] MAQUEDA                     MRN:          [id number]  DATE:12/11/2005                            DOB:          June 01, 1960    PRIMARY CARDIOLOGIST:  Dr. Willa Rough.   REASON FOR OFFICE VISIT:  [name] is a 50 year old female, recently  referred to Dr. Simona Huh in consultation for evaluation of chest pain  here at [facility] on August 16, who now presents for a post hospital  followup.  The patient has longstanding history of recurrent chest pain,  with no documentation of coronary artery disease by catheterization, and a  previous nonischemic Cardiolite in 2004.  Of note, however, she also has  history of medication noncompliance.   Most recently, the patient was felt to have atypical chest pain with  nonspecific cardiac markers, with a peak troponin of 0.04.  Dr. Diona Browner  noted no significant EKG changes, and recommended a further evaluation with  outpatient 2D echocardiogram and a repeat stress test.   The echocardiogram showed continued preserved LVF (65-70%) with mild mitral  regurgitation and mild tricuspid regurgitation, with mildly elevated PASP.  There was also question of a small pericardial effusion.   The stress test, however, was an adenosine perfusion study, and this raised  a question of a mild anterior/anteroapical defect with reversibility in the  setting of significant OVH.  Left ventricular function was calculated at  42%, with question of mild anterior/apical hyperkinesis.   The patient now presents in followup, and continues to report recurrent  chest pain on a daily basis.  Essentially, she states that this has been  going on for approximately 5 years, and that it is constant, described as  sharp, and exacerbated either by stress or by walking up a flight of stairs  or brisk walking.  Of note, however, there is no pleuritic  component to  this.   The patient also continues to smoke, but has been trying to cut down since  her recent hospitalization.   CURRENT MEDICATIONS:  Accupril 10 mg daily.   PHYSICAL EXAMINATION:  Blood pressure 132/80, pulse 78, regular.  Weight  135.  GENERAL:  50 year old female, sitting upright in no apparent distress.  NECK:  Palpable bilateral carotid pulses with high-pitched left carotid  bruit as well as a left supraclavicular bruit.  LUNGS:  Clear to auscultation all fields.  HEART:  Regular rate and rhythm (S1, S2), 2-3/6 short, high-pitched systolic  ejection murmur in the upper LSB; no diastolic murmur.  ABDOMEN:  Soft, nontender, with active bowel sounds.  EXTREMITIES:  Palpable femoral pulses without bruits; palpable distal pulses  without significant edema.  NEUROLOGIC:  No focal deficit.   IMPRESSION:  1. Chest pain syndrome.      a.     Longstanding history of recurrent chest pain with typical       features.      b.     Recent abnormal adenosine Cardiolite with question of       anterior/anteroapical ischemia.  c.     Nonischemic Cardiolite 2004.  2. Multiple cardiac risk factors.      a.     Hypertension.      b.     History of hyperlipidemia.      c.     Ongoing tobacco.  3. Left carotid bruit.  4. Normal left ventricular function.      a.     By a recent echocardiogram.  5. Valvular heart disease.      a.     Mild mitral regurgitation/tricuspid regurgitation.   PLAN:  The patient was presented with the option of pursuing further  ischemic workup, either with a cardiac CT scan versus a diagnostic cardiac  catheterization.  She elected to proceed with the latter so as to provide  definitive exclusion of significant coronary artery disease.  We therefore  have agreed to arrange this in the [facility] catheterization lab within the next  several days.  The risks/benefits of the procedure were described to the  patient, and she is agreeable to proceed.  Of  note, the patient is only on  an ACE inhibitor at present.  She will be started on aspirin, and will also  be provided with a prescription for nitroglycerin.  The patient also needs  outpatient evaluation for a noted left carotid bruit.  She has no known  history of cerebrovascular disease.   The plan was discussed and approved by Dr. Lewayne Bunting.                                   Gene Serpe, PA-C                                Learta Codding, MD,FACC   GS/MedQ  DD:  12/11/2005  DT:  12/12/2005  Job #:  [id number]   cc:   Lia Hopping

## 2010-08-24 NOTE — Consult Note (Signed)
[name], [name]                        ACCOUNT NO.:  [id number]   MEDICAL RECORD NO.:  [id number]                   PATIENT TYPE:  INP   LOCATION:  A202                                 FACILITY:  [facility]   PHYSICIAN:  Hanley Hays. Dechurch, M.D.           DATE OF BIRTH:  Aug 22, 1960   DATE OF CONSULTATION:  11/23/2002  DATE OF DISCHARGE:                                   CONSULTATION   A 50 year old Caucasian female who is referred by Dr. Duane Lope for  medical management.  The patient is a healthy woman who was being induced by  anesthesia for elective hysterectomy and bladder procedure for uterine  prolapse and cystocele who was noted to have ST segment depression/changes.  She was also noted to be modestly hypertensive with blood pressures in the  160s/90-100.  Surgery was cancelled and she was admitted to the hospital for  evaluation and rule out ischemia.  She has already been seen by cardiology.   PAST MEDICAL HISTORY:  Unremarkable.  She has a history of anxiety disorder.  She has been on different medications in the past but none recently.  She is  on no over the counter medications or prescription medications at this time.  She is gravida 2, para 2.  Her pregnancies were unremarkable as far as for  problems and no hypertension.  She has had no other surgeries.   REVIEW OF SYSTEMS:  The patient is primarily sedentary.  She denies any  changes in activity tolerance.  No orthopnea, PND, no palpitations.  She  states her anxiety has been well controlled.  GU symptoms include  dysmenorrhea and stress incontinence.  She has had no chest pain, no GI  complaints.   SOCIAL HISTORY:  She smokes a half pack a day.  She is divorced.  She has  two children ages 72 and 33.  No alcohol or illicit drug use.  She works as  a Clinical cytogeneticist.  She is listed as being disabled.   FAMILY HISTORY:  Pertinent that four of her siblings have hypertension.  One  brother has diabetes.  Her  father died with COPD.  Mother has hypertension  but no cardiac deaths, no MI or unexplained death.   ALLERGIES:  ERYTHROMYCIN for which she develops a rash.   MEDICATIONS:  None, although she is currently receiving Lopressor 50 q.6h.   PHYSICAL EXAMINATION:  GENERAL:  Reveals a well-developed, well-nourished,  white female who is alert and pleasant, no complaints.  Able to move bowel  without difficulty.  VITAL SIGNS:  Blood pressure is 118/70, pulse is 54 and regular,  respirations are unlabored.  LUNGS:  Diminished with a few rhonchi but no rales are noted.  NECK:  Supple.  No JVD, adenopathy, thyromegaly.  Oropharynx is moist.  ABDOMEN:  Slightly obese, soft, nontender.  Active bowel sounds.  EXTREMITIES:  Without clubbing, cyanosis or edema.  She has good distal  pulses.  NEUROLOGIC:  Completely intact.  SKIN:  Without rash, lesion or breakdown.   ASSESSMENT/PLAN:  Abnormal electrocardiogram in a 50 year old who probably  has underlying hypertension which has not been adequately documented or  treated.  I did obtain an electrocardiogram from May 2002 and indeed there  is no significant change compared with that obtained today.  Fortunately her  cardiac enzymes thus far are negative.  Given these findings and planned  surgery, a stress Cardiolite would be reasonable.  The patient has had these  things all ordered by cardiology.  I am going to decrease her Lopressor to  50 twice daily and monitor.  Certainly another agent may be better in this  lady with probable chronic obstructive pulmonary disease from her ongoing  tobacco abuse, given her history.  The labs were otherwise normal.  The  echocardiogram is reviewed.  The patient will need to have followup as an  outpatient of her blood pressure and will more likely need more aggressive  therapies.  There is no other suggestion of a other secondary pathology at  this point.  We will be happy to follow along with you and manage her  needs.                                               Hanley Hays Josefine Class, M.D.    FED/MEDQ  D:  11/23/2002  T:  11/23/2002  Job:  [id number]

## 2010-08-24 NOTE — Procedures (Signed)
   [name], [name]                        ACCOUNT NO.:  [id number]   MEDICAL RECORD NO.:  [id number]                   PATIENT TYPE:  INP   LOCATION:  A202                                 FACILITY:  [facility]   PHYSICIAN:  Vida Roller, M.D.                DATE OF BIRTH:  25-Jul-1960   DATE OF PROCEDURE:  11/23/2002  DATE OF DISCHARGE:                                  ECHOCARDIOGRAM   TAPE NUMBER:  [id number].   TAPE COUNT:  Unknown starting number. The ending number is [id number].   HISTORY OF PRESENT ILLNESS:  This is a woman with an abnormal EKG who is  preop for a vaginal hysterectomy.   M-MODE MEASUREMENTS:  The aorta is 25 mm.   Left atrium is 33 mm.   Septum is 14 mm.   Posterior wall is 13 mm.   Left ventricular diastolic dimension is 39 mm.   Left ventricular systolic dimension is 24 mm.   2-D AND DOPPLER IMAGING:  The image quality is adequate. The left ventricle  is normal size with mild concentric left ventricular hypertrophy. There is a  vigorous left ventricular systolic function, but no evidence of a left  ventricular outflow obstruction or anterior motion of the mitral leaflet.  Diastolic function appears to be mildly impaired.   The right ventricle is normal size with normal systolic function.   Both atria appear to be normal size with no obvious atrial septal defect.   The aortic valve is tri-leaflet, tri-commissural, with no evidence of  stenosis. There is trace insufficiency.   The mitral valve is morphologically unremarkable with mild insufficiency. No  stenosis is seen.   The tricuspid valve is morphologically unremarkable with mild insufficiency.  No stenosis is seen.   The pulmonic valve has trace insufficiency. No stenosis is seen.   The pericardial structures appear to have a very small fusion versus  pericardial fat pad of no hemodynamic significance.   The ascending aorta appears to be normal size.   The inferior vena cava appears to be  normal size and has appropriate  respirophasic variation.                                               Vida Roller, M.D.    JH/MEDQ  D:  11/23/2002  T:  11/23/2002  Job:  [id number]

## 2010-09-10 ENCOUNTER — Ambulatory Visit (HOSPITAL_COMMUNITY)
Admission: RE | Admit: 2010-09-10 | Discharge: 2010-09-10 | Disposition: A | Discharge status: Home / Self Care | Payer: BC Managed Care – PPO | Source: Ambulatory Visit | Attending: Cardiology | Admitting: Cardiology

## 2010-09-10 DIAGNOSIS — I70219 Atherosclerosis of native arteries of extremities with intermittent claudication, unspecified extremity: Secondary | ICD-10-CM | POA: Insufficient documentation

## 2010-09-10 DIAGNOSIS — E785 Hyperlipidemia, unspecified: Secondary | ICD-10-CM | POA: Insufficient documentation

## 2010-09-10 DIAGNOSIS — R0602 Shortness of breath: Secondary | ICD-10-CM | POA: Insufficient documentation

## 2010-09-10 DIAGNOSIS — R079 Chest pain, unspecified: Secondary | ICD-10-CM | POA: Insufficient documentation

## 2010-09-10 DIAGNOSIS — M129 Arthropathy, unspecified: Secondary | ICD-10-CM | POA: Insufficient documentation

## 2010-09-10 DIAGNOSIS — I10 Essential (primary) hypertension: Secondary | ICD-10-CM | POA: Insufficient documentation

## 2010-09-10 DIAGNOSIS — Z87891 Personal history of nicotine dependence: Secondary | ICD-10-CM | POA: Insufficient documentation

## 2010-09-10 LAB — POCT I-STAT 3, VENOUS BLOOD GAS (G3P V)
Acid-base deficit: 1 mmol/L (ref 0.0–2.0)
O2 Saturation: 69 %
pO2, Ven: 40 mmHg (ref 30.0–45.0)

## 2010-09-10 LAB — POCT I-STAT 3, ART BLOOD GAS (G3+)
Acid-base deficit: 1 mmol/L (ref 0.0–2.0)
Bicarbonate: 24.2 mEq/L — ABNORMAL HIGH (ref 20.0–24.0)

## 2010-10-02 NOTE — Cardiovascular Report (Signed)
NAMEMarland Kitchen  [name], [name] NO.:  [id number]  MEDICAL RECORD NO.:  [id number]  LOCATION:  [facility]                         FACILITY:  [facility]  PHYSICIAN:  Pamella Pert, MD DATE OF BIRTH:  [birth year]  DATE OF PROCEDURE:  09/10/2010 DATE OF DISCHARGE:                           CARDIAC CATHETERIZATION   REFERRING PHYSICIAN:  Dwan Bolt, MD, at [facility].  PROCEDURES PERFORMED: 1. Left ventriculography. 2. Selected right and left coronary arteriography. 3. Ascending aortogram. 4. The right heart catheterization and calculation of cardiac output     and cardiac index by Fick.  INDICATIONS:  [name] is a pleasant 50 year old female with history of known peripheral arterial disease.  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 She also has had a history of right iliac stent in 2010.  She presented with chest pain and also shortness of breath.  A stress Cardiolite had revealed anterolateral ischemia with ejection fraction of 25%.  It was considered a high risk study.  She is now brought to the cardiac cath lab to evaluate her coronary anatomy.  Right heart catheterization is being performed to evaluate for pulmonary hypertension given her longstanding history of smoking and dyspnea on exertion.  RIGHT HEART CATHETERIZATION HEMODYNAMIC DATA: 1. RA pressure 20/19, mean 17 mmHg. 2. RV pressure 47/10, with end-diastolic pressure of 16 mmHg. 3. PA pressure 51/27, with a mean of 38 mmHg.  PA saturation 69%. 4. Pulmonary capillary wedge pressure 27/29, mean 24 mmHg.  Aortic     saturation was 96%. 5. Cardiac output by Fick was 2.1 with a cardiac index of 2.63, lower     limit of normal.  LEFT HEART CATHETERIZATION HEMODYNAMIC DATA: 1. Left ventricular pressure was 128/13 with end-diastolic pressure of     22 mmHg. 2. Aortic pressure was 115/80 with a mean of 97 mmHg.  THERE WAS A 60-     70 MM  INTRAVENTRICULAR PRESSURE GRADIENT WITHIN THE LEFT VENTRICLE.  ANGIOGRAPHIC DATA:  Left ventricle.  Left ventricular systolic function was hyperdynamic.  Ejection fraction was estimated around 80-90%.  There was dynamic left ventricular cavity collapse. Right coronary artery.  Right coronary artery is a large-caliber vessel and a dominant vessel.  It gives origin to large PDA which has smooth and normal.  Right coronary artery in the proximal segment has a very mild luminal irregularities. Left main coronary artery:  Left main coronary artery is a large-caliber vessel.  It is smooth and normal. Circumflex.  Circumflex coronary artery is a large-caliber vessel, smooth except at the AV groove branch.  The circumflex itself has a smooth 60% stenoses. LAD.  LAD is a large-caliber vessel.  It has got mild 20-30% luminal irregularity in the proximal-to-mid segment.  It gives origin to 2 small- to-moderate sized diagonals and several smaller diagonals.  IMPRESSION: 1. One-vessel coronary artery disease involving the mid circumflex     coronary artery.  It is about 60% stenosed. 2. Hyperdynamic left ventricular systolic function.  The ejection     fraction of 80-90% with intraventricular pressure gradient of 60-70     mmHg. 3. Moderate pulmonary hypertension.  RECOMMENDATIONS: 1. Medical  therapy for single-vessel coronary artery disease is     indicated.  I suspect her abnormal stress test to be secondary to     hyperdynamic left ventricle and hypertension with hypertensive     heart disease. 2. She needs negative chronotropic and inotropic agents to improve     diastolic function. 3. Moderate pulmonary hypertension, probably related to her tobacco     use and COPD.  Smoking cessation is indicated.  A total of 100 mL of contrast was utilized for diagnostic angiography.  TECHNIQUE OF PROCEDURE:  Under sterile precautions using a 5-French right antecubital vein access and a 6-French  right radial access, right and left heart catheterization was performed.  Using a 5-French balloon-tipped catheter which was advanced into the pulmonary catheter wedge position easily without any complication. Right heart catheterization was performed and the hemodynamics and the waveforms were carefully analyzed and then the catheter was pulled out of the body.  Left heart catheterization was performed using radial access.  A 6- [place] TIG #4 catheter was advanced into the ascending aorta and selective left and right coronary arteriography was performed. Intracoronary nitroglycerin was also administered and angiography was performed.  The catheter was then pulled out of the body over an exchange length J-wire.  The left ventriculography was performed using a 5-French pigtail catheter in the RAO projection, injecting the total of 30 mL of contrast and the catheter was then pulled into the ascending aorta.  Pressure gradient across the aortic valve was evaluated.  The catheter then pulled out of the body over a J-wire.  Hemostasis was obtained by applying TR band at the radial arterial access site and manual pressure in the antecubital vein site.  The patient tolerated the procedure with no immediate complications.     Pamella Pert, MD     JRG/MEDQ  D:  09/10/2010  T:  09/10/2010  Job:  [id number]  cc:   Dwan Bolt, MD  Electronically Signed by Yates Decamp MD on 10/02/2010 10:19:31 AM

## 2010-10-26 ENCOUNTER — Encounter: Payer: Self-pay | Admitting: *Deleted

## 2010-10-26 ENCOUNTER — Emergency Department (HOSPITAL_COMMUNITY): Payer: BC Managed Care – PPO

## 2010-10-26 ENCOUNTER — Other Ambulatory Visit: Payer: Self-pay

## 2010-10-26 ENCOUNTER — Emergency Department (HOSPITAL_COMMUNITY)
Admission: EM | Admit: 2010-10-26 | Discharge: 2010-10-27 | Disposition: A | Discharge status: Home / Self Care | Payer: BC Managed Care – PPO | Attending: Emergency Medicine | Admitting: Emergency Medicine

## 2010-10-26 DIAGNOSIS — Z79899 Other long term (current) drug therapy: Secondary | ICD-10-CM | POA: Insufficient documentation

## 2010-10-26 DIAGNOSIS — I10 Essential (primary) hypertension: Secondary | ICD-10-CM | POA: Insufficient documentation

## 2010-10-26 DIAGNOSIS — I959 Hypotension, unspecified: Secondary | ICD-10-CM | POA: Insufficient documentation

## 2010-10-26 DIAGNOSIS — R42 Dizziness and giddiness: Secondary | ICD-10-CM | POA: Insufficient documentation

## 2010-10-26 DIAGNOSIS — R079 Chest pain, unspecified: Secondary | ICD-10-CM | POA: Insufficient documentation

## 2010-10-26 DIAGNOSIS — F17200 Nicotine dependence, unspecified, uncomplicated: Secondary | ICD-10-CM | POA: Insufficient documentation

## 2010-10-26 DIAGNOSIS — R110 Nausea: Secondary | ICD-10-CM | POA: Insufficient documentation

## 2010-10-26 HISTORY — DX: Essential (primary) hypertension: I10

## 2010-10-26 LAB — URINALYSIS, ROUTINE W REFLEX MICROSCOPIC
Bilirubin Urine: NEGATIVE
Glucose, UA: NEGATIVE mg/dL
Ketones, ur: NEGATIVE mg/dL
Protein, ur: NEGATIVE mg/dL
Urobilinogen, UA: 0.2 mg/dL (ref 0.0–1.0)

## 2010-10-26 LAB — BASIC METABOLIC PANEL
BUN: 13 mg/dL (ref 6–23)
CO2: 28 mEq/L (ref 19–32)
Calcium: 9.3 mg/dL (ref 8.4–10.5)
Creatinine, Ser: 1.09 mg/dL (ref 0.50–1.10)
GFR calc non Af Amer: 53 mL/min — ABNORMAL LOW (ref >60)
Glucose, Bld: 94 mg/dL (ref 70–99)

## 2010-10-26 LAB — CBC
Hemoglobin: 12.7 g/dL (ref 12.0–15.0)
MCH: 32 pg (ref 26.0–34.0)
MCHC: 35.7 g/dL (ref 30.0–36.0)
MCV: 89.7 fL (ref 78.0–100.0)
RBC: 3.97 MIL/uL (ref 3.87–5.11)

## 2010-10-26 LAB — URINE MICROSCOPIC-ADD ON

## 2010-10-26 LAB — CARDIAC PANEL(CRET KIN+CKTOT+MB+TROPI)
Relative Index: 5.8 — ABNORMAL HIGH (ref 0.0–2.5)
Total CK: 113 U/L (ref 7–177)

## 2010-10-26 MED ORDER — SODIUM CHLORIDE 0.9 % IV BOLUS (SEPSIS): 2000.0000 mL | Freq: Once | INTRAVENOUS | Status: DC

## 2010-10-26 MED ORDER — ASPIRIN 325 MG PO TABS
325.0000 mg | ORAL_TABLET | ORAL | Status: AC
  Administered 2010-10-26: 325 mg via ORAL

## 2010-10-26 MED ORDER — NITROGLYCERIN 0.4 MG SL SUBL: 0.4000 mg | SUBLINGUAL_TABLET | SUBLINGUAL | Status: DC | PRN

## 2010-10-26 NOTE — ED Notes (Signed)
Took aspirin prior to arrival

## 2010-10-26 NOTE — ED Notes (Signed)
States she is stressed at home, states she has a Psychologist, counselling at home today, states she recently gained custody of her grandson, states he has ADHD  And she has had him for 6 months. Patient is tearful at the present. States she may be depressed.

## 2010-10-26 NOTE — ED Notes (Signed)
CRITICAL VALUE ALERT  Critical value received:  ckmb   6.5  Date of notification:  10/26/10  Time of notification:  2257  Critical value read back:yes  Nurse who received alert: Thornton Dales RN  MD notified (1st page):    Time of first page:    MD notified (2nd page):  Time of second page:  Responding MD:  Lynelle Doctor  Time MD responded:  2300

## 2010-10-26 NOTE — ED Provider Notes (Addendum)
History    Pt relates today she was talking on the phone and started feeling dizzy and lightheaded lilke she would pass out. Had nausea without vomtiing. And started getting left sided chest pain that was dull and nagging. Relates she has seen Dr Shon Baton and had an abn stress test and had a cardiac cath in June that showed no arterial blockages but some vein blockages and he was going to treat her medically. She has a stent in her RLE and has had a subclavian bypass done both in [place]. Denies anything different, no change in meds, not working outside, + nausea, but no vomiting, diarrhea, cough, stomach pains, diaphoresis, shortness of breath. Has had a normal appetite. Feels better after getting IV fluids.  Chief Complaint  Patient presents with  . Chest Pain   Patient is a 50 y.o. female presenting with chest pain. The history is provided by the patient.  Chest Pain The chest pain began 3 - 5 hours ago. Chest pain occurs constantly. The chest pain is improving. The quality of the pain is described as dull. The pain does not radiate. She tried nothing for the symptoms.     Past Medical History  Diagnosis Date  . Hypertension     Past Surgical History  Procedure Date  . Cardiac catheterization     No family history on file.  History  Substance Use Topics  . Smoking status: Current Everyday Smoker -- 0.5 packs/day  . Smokeless tobacco: Not on file  . Alcohol Use:     OB History    Grav Para Term Preterm Abortions TAB SAB Ect Mult Living                  Review of Systems  Cardiovascular: Positive for chest pain.  All other systems reviewed and are negative.    Physical Exam  BP 95/53  Pulse 67  Temp(Src) 98.2 F (36.8 C) (Oral)  Resp 18  Ht 4\' 10"  (1.473 m)  Wt 134 lb (60.782 kg)  BMI 28.01 kg/m2  SpO2 98%  Physical Exam  Constitutional: She appears well-developed and well-nourished.  HENT:  Head: Normocephalic and atraumatic.  Mouth/Throat: Mucous membranes are  dry.  Eyes: EOM are normal. Pupils are equal, round, and reactive to light.  Neck: Normal range of motion.  Cardiovascular: Normal rate and regular rhythm.   Murmur heard.  Systolic murmur is present with a grade of 2/6  Pulmonary/Chest: Effort normal and breath sounds normal.  Abdominal: Soft. Normal appearance. There is tenderness.  Musculoskeletal: Normal range of motion.  Neurological: She is alert. She has normal strength. No cranial nerve deficit.  Skin: Skin is warm and dry. No rash noted.  Psychiatric: She has a normal mood and affect. Her speech is normal and behavior is normal. Thought content normal.    ED Course  Procedures  MDM   Date: 10/26/2010  Rate: 75 Rhythm: normal sinus rhythm  QRS Axis: normal  Intervals: normal  ST/T Wave abnormalities: nonspecific ST/T changes  Conduction Disutrbances:nonspecific intraventricular conduction delay  Narrative Interpretation:   Old EKG Reviewed: unchangedfrom 09/10/2010  Pt was hypotensive with BP in the 70's and responded to IV fluids.    10:39 PM Pt now tearful, she has been taking care of a young grandson since Feb b/o drug addiction and abuse by his parents. He is a ward of the county but she is his caretaker. States he is getting conseling but he has behavioral issues. She has gotten 1  liter of IV fluid and still hasn't had any urine output. Is getting a second liter.  Pt admits she isn't eating well b/o stress.    1:16 AM   Pt discussed with Dr Shon Baton, feels CK's not significant can see in office about her BP meds soon, to call for appointment.    Results for orders placed during the hospital encounter of 10/26/10  CBC      Component Value Range   WBC 6.1  4.0 - 10.5 (K/uL)   RBC 3.97  3.87 - 5.11 (MIL/uL)   Hemoglobin 12.7  12.0 - 15.0 (g/dL)   HCT 16.1 (*) 09.6 - 46.0 (%)   MCV 89.7  78.0 - 100.0 (fL)   MCH 32.0  26.0 - 34.0 (pg)   MCHC 35.7  30.0 - 36.0 (g/dL)   RDW 04.5  40.9 - 81.1 (%)   Platelets 186   150 - 400 (K/uL)  BASIC METABOLIC PANEL      Component Value Range   Sodium 138  135 - 145 (mEq/L)   Potassium 3.8  3.5 - 5.1 (mEq/L)   Chloride 100  96 - 112 (mEq/L)   CO2 28  19 - 32 (mEq/L)   Glucose, Bld 94  70 - 99 (mg/dL)   BUN 13  6 - 23 (mg/dL)   Creatinine, Ser 9.14  0.50 - 1.10 (mg/dL)   Calcium 9.3  8.4 - 78.2 (mg/dL)   GFR calc non Af Amer 53 (*) >60 (mL/min)   GFR calc Af Amer >60  >60 (mL/min)  URINALYSIS, ROUTINE W REFLEX MICROSCOPIC      Component Value Range   Color, Urine YELLOW  YELLOW    Appearance CLEAR  CLEAR    Specific Gravity, Urine 1.010  1.005 - 1.030    pH 7.0  5.0 - 8.0    Glucose, UA NEGATIVE  NEGATIVE (mg/dL)   Hgb urine dipstick SMALL (*) NEGATIVE    Bilirubin Urine NEGATIVE  NEGATIVE    Ketones, ur NEGATIVE  NEGATIVE (mg/dL)   Protein, ur NEGATIVE  NEGATIVE (mg/dL)   Urobilinogen, UA 0.2  0.0 - 1.0 (mg/dL)   Nitrite NEGATIVE  NEGATIVE    Leukocytes, UA NEGATIVE  NEGATIVE   CARDIAC PANEL(CRET KIN+CKTOT+MB+TROPI)      Component Value Range   Total CK 113  7 - 177 (U/L)   CK, MB 6.5 (*) 0.3 - 4.0 (ng/mL)   Troponin I <0.30  <0.30 (ng/mL)   Relative Index 5.8 (*) 0.0 - 2.5   CARDIAC PANEL(CRET KIN+CKTOT+MB+TROPI)      Component Value Range   Total CK 102  7 - 177 (U/L)   CK, MB 6.9 (*) 0.3 - 4.0 (ng/mL)   Troponin I <0.30  <0.30 (ng/mL)   Relative Index 6.8 (*) 0.0 - 2.5   URINE MICROSCOPIC-ADD ON      Component Value Range   Squamous Epithelial / LPF FEW (*) RARE    WBC, UA 0-2  <3 (WBC/hpf)   RBC / HPF 0-2  <3 (RBC/hpf)   Bacteria, UA RARE  RARE    Dg Chest Portable 1 View  10/26/2010  *RADIOLOGY REPORT*  Clinical Data: Near-syncope  PORTABLE CHEST - 1 VIEW  Comparison: Chest radiograph 02/12/2007  Findings: Normal mediastinum and enlarged silhouette.  Costophrenic angles are clear.  No evidence effusion, infiltrate, or pneumothorax.  IMPRESSION: Mild cardiomegaly.  No acute cardiopulmonary process.  Original Report Authenticated By:  Genevive Bi, M.D.     Ward Givens, MD  10/27/10 1610  Ward Givens, MD 10/27/10 [phone]

## 2010-10-27 LAB — CARDIAC PANEL(CRET KIN+CKTOT+MB+TROPI)
CK, MB: 6.9 ng/mL (ref 0.3–4.0)
Relative Index: 6.8 — ABNORMAL HIGH (ref 0.0–2.5)
Total CK: 102 U/L (ref 7–177)

## 2011-01-02 ENCOUNTER — Other Ambulatory Visit: Payer: Self-pay | Admitting: Family Medicine

## 2011-01-02 DIAGNOSIS — Z1231 Encounter for screening mammogram for malignant neoplasm of breast: Secondary | ICD-10-CM

## 2011-01-15 ENCOUNTER — Ambulatory Visit
Admission: RE | Admit: 2011-01-15 | Discharge: 2011-01-15 | Disposition: A | Discharge status: Home / Self Care | Payer: BC Managed Care – PPO | Source: Ambulatory Visit | Attending: Family Medicine | Admitting: Family Medicine

## 2011-01-15 DIAGNOSIS — Z1231 Encounter for screening mammogram for malignant neoplasm of breast: Secondary | ICD-10-CM

## 2011-01-15 LAB — URINE MICROSCOPIC-ADD ON

## 2011-01-15 LAB — CBC
HCT: 31.8 — ABNORMAL LOW
HCT: 37.5
Hemoglobin: 10.9 — ABNORMAL LOW
Hemoglobin: 12.8
MCHC: 34.1
MCV: 91.6
MCV: 93.2
Platelets: 213
RDW: 13.6
RDW: 13.7

## 2011-01-15 LAB — POCT CARDIAC MARKERS
CKMB, poc: 2.1
Troponin i, poc: <0.05

## 2011-01-15 LAB — LIPID PANEL
Cholesterol: 207 — ABNORMAL HIGH
LDL Cholesterol: 151 — ABNORMAL HIGH
Total CHOL/HDL Ratio: 7.7

## 2011-01-15 LAB — BASIC METABOLIC PANEL
CO2: 27
Chloride: 107
GFR calc non Af Amer: >60
Glucose, Bld: 87
Potassium: 4
Sodium: 140

## 2011-01-15 LAB — URINALYSIS, ROUTINE W REFLEX MICROSCOPIC
Glucose, UA: NEGATIVE
Leukocytes, UA: NEGATIVE
pH: 6

## 2011-01-15 LAB — I-STAT 8, (EC8 V) (CONVERTED LAB)
Acid-Base Excess: 3 — ABNORMAL HIGH
HCT: 39
Operator id: 161631
Potassium: 4.6
TCO2: 31
pCO2, Ven: 54.5 — ABNORMAL HIGH
pH, Ven: 7.343 — ABNORMAL HIGH

## 2011-01-15 LAB — COMPREHENSIVE METABOLIC PANEL
ALT: 18
AST: 25
CO2: 26
Calcium: 9
Chloride: 107
Creatinine, Ser: 0.95
GFR calc Af Amer: >60
GFR calc non Af Amer: >60
Glucose, Bld: 132 — ABNORMAL HIGH
Sodium: 142
Total Bilirubin: 0.6

## 2011-01-15 LAB — DIFFERENTIAL
Basophils Absolute: 0
Eosinophils Absolute: 0.1
Eosinophils Relative: 2
Monocytes Absolute: 0.6

## 2011-01-15 LAB — CK TOTAL AND CKMB (NOT AT ARMC)
CK, MB: 2.3
Relative Index: 1.9
Relative Index: 2.4
Total CK: 121
Total CK: 155

## 2011-01-15 LAB — APTT: aPTT: 27

## 2011-01-15 LAB — TROPONIN I: Troponin I: 0.03

## 2011-01-15 LAB — MAGNESIUM: Magnesium: 2

## 2011-01-15 LAB — RAPID URINE DRUG SCREEN, HOSP PERFORMED: Barbiturates: NOT DETECTED

## 2011-04-07 ENCOUNTER — Encounter (HOSPITAL_COMMUNITY): Payer: Self-pay | Admitting: Emergency Medicine

## 2011-04-07 ENCOUNTER — Emergency Department (HOSPITAL_COMMUNITY)
Admission: EM | Admit: 2011-04-07 | Discharge: 2011-04-08 | Disposition: A | Discharge status: Home / Self Care | Payer: BC Managed Care – PPO | Attending: Emergency Medicine | Admitting: Emergency Medicine

## 2011-04-07 DIAGNOSIS — R221 Localized swelling, mass and lump, neck: Secondary | ICD-10-CM | POA: Insufficient documentation

## 2011-04-07 DIAGNOSIS — E789 Disorder of lipoprotein metabolism, unspecified: Secondary | ICD-10-CM | POA: Insufficient documentation

## 2011-04-07 DIAGNOSIS — H9209 Otalgia, unspecified ear: Secondary | ICD-10-CM | POA: Insufficient documentation

## 2011-04-07 DIAGNOSIS — R220 Localized swelling, mass and lump, head: Secondary | ICD-10-CM | POA: Insufficient documentation

## 2011-04-07 DIAGNOSIS — K047 Periapical abscess without sinus: Secondary | ICD-10-CM | POA: Insufficient documentation

## 2011-04-07 DIAGNOSIS — I10 Essential (primary) hypertension: Secondary | ICD-10-CM | POA: Insufficient documentation

## 2011-04-07 DIAGNOSIS — Z79899 Other long term (current) drug therapy: Secondary | ICD-10-CM | POA: Insufficient documentation

## 2011-04-07 DIAGNOSIS — K089 Disorder of teeth and supporting structures, unspecified: Secondary | ICD-10-CM | POA: Insufficient documentation

## 2011-04-07 DIAGNOSIS — F17200 Nicotine dependence, unspecified, uncomplicated: Secondary | ICD-10-CM | POA: Insufficient documentation

## 2011-04-07 HISTORY — DX: Pure hypercholesterolemia, unspecified: E78.00

## 2011-04-07 NOTE — ED Notes (Signed)
C/o dental abscess- reports pain and swelling since this morning.

## 2011-04-08 MED ORDER — CLINDAMYCIN HCL 150 MG PO CAPS: 300.0000 mg | ORAL_CAPSULE | Freq: Four times a day (QID) | ORAL | Status: AC

## 2011-04-08 MED ORDER — OXYCODONE-ACETAMINOPHEN 5-325 MG PO TABS: 1.0000 | ORAL_TABLET | Freq: Four times a day (QID) | ORAL | Status: AC | PRN

## 2011-04-08 MED ORDER — CLINDAMYCIN HCL 150 MG PO CAPS
300.0000 mg | ORAL_CAPSULE | Freq: Once | ORAL | Status: AC
  Administered 2011-04-08: 300 mg via ORAL
  Filled 2011-04-08: qty 2

## 2011-04-08 NOTE — ED Provider Notes (Signed)
History     CSN: [id number]  Arrival date & time 04/07/11  2315   First MD Initiated Contact with Patient 04/08/11 0134      Chief Complaint  Patient presents with  . Dental Pain    (Consider location/radiation/quality/duration/timing/severity/associated sxs/prior treatment) HPI Comments: Patient with right lower jaw swelling and pain since this morning. Patient has a history of dental abscess in the area. She finished a course of cephalexin one month ago. Patient denies neck swelling, trouble breathing. Patient complains of low-grade fever.  Patient is a 50 y.o. female presenting with tooth pain. The history is provided by the patient.  Dental PainThe primary symptoms include mouth pain and headaches. Primary symptoms do not include fever, shortness of breath or sore throat. The symptoms began 12 to 24 hours ago. The symptoms are worsening. The symptoms are recurrent.  Additional symptoms include: facial swelling and ear pain. Additional symptoms do not include: trismus and trouble swallowing.    Past Medical History  Diagnosis Date  . Hypertension   . High cholesterol     Past Surgical History  Procedure Date  . Cardiac catheterization     No family history on file.  History  Substance Use Topics  . Smoking status: Current Everyday Smoker -- 0.5 packs/day  . Smokeless tobacco: Not on file  . Alcohol Use: No    OB History    Grav Para Term Preterm Abortions TAB SAB Ect Mult Living                  Review of Systems  Constitutional: Negative for fever.  HENT: Positive for ear pain and facial swelling. Negative for sore throat, rhinorrhea and trouble swallowing.        Positive for dental pain  Eyes: Negative for discharge.  Respiratory: Negative for shortness of breath.   Skin: Negative for color change.  Neurological: Positive for headaches.  Psychiatric/Behavioral: Negative for confusion.    Allergies  Erythromycin  Home Medications   Current  Outpatient Rx  Name Route Sig Dispense Refill  . LISINOPRIL-HYDROCHLOROTHIAZIDE 20-12.5 MG PO TABS Oral Take 1 tablet by mouth daily.      Marland Kitchen PRESCRIPTION MEDICATION Oral Take 1 tablet by mouth daily. Metoprolol.Marland KitchenMarland KitchenLopressor or Toprol XL unknown Patient takes once daily       BP 171/106  Pulse 117  Temp(Src) 99.6 F (37.6 C) (Oral)  Resp 20  SpO2 97%  Physical Exam  Nursing note and vitals reviewed. Constitutional: She is oriented to person, place, and time. She appears well-developed and well-nourished.  HENT:  Head: Normocephalic and atraumatic.  Right Ear: Tympanic membrane and external ear normal.  Left Ear: Tympanic membrane and external ear normal.  Mouth/Throat: Uvula is midline. Dental abscesses present.       Patient with R mandibular tooth pain and tenderness to palpation in area of premolars. There is a gross abscess.  Eyes: Conjunctivae are normal.  Neck: Normal range of motion. Neck supple.       No Ludwig's angina  Lymphadenopathy:    She has no cervical adenopathy.  Neurological: She is alert and oriented to person, place, and time.  Skin: Skin is warm and dry.    ED Course  Procedures (including critical care time)  Labs Reviewed - No data to display No results found.   1. Dental abscess     Patient seen and examined. D/w and seen by Dr. Bebe Shaggy. Patient refuses drainage. Patient counseled on use of narcotic pain medications. Counseled  not to combine these medications with others containing tylenol. Urged not to drink alcohol, drive, or perform any other activities that requires focus while taking these medications. The patient verbalizes understanding and agrees with the plan.    MDM  Patient with dental abscess.  Exam unconcerning for Ludwig's angina.  Will treat with clindamycin given recent antibiotic usage and pain medicine.  Urged patient to follow-up with dentist.            Carolee Rota, PA 04/08/11 0700

## 2011-04-09 NOTE — ED Provider Notes (Signed)
Medical screening examination/treatment/procedure(s) were conducted as a shared visit with non-physician practitioner(s) and myself.  I personally evaluated the patient during the encounter   Joya Gaskins, MD 04/09/11 [phone]

## 2011-09-24 ENCOUNTER — Encounter (HOSPITAL_COMMUNITY): Payer: Self-pay | Admitting: Emergency Medicine

## 2011-09-24 ENCOUNTER — Emergency Department (HOSPITAL_COMMUNITY)
Admission: EM | Admit: 2011-09-24 | Discharge: 2011-09-24 | Disposition: A | Discharge status: Home / Self Care | Payer: BC Managed Care – PPO | Attending: Emergency Medicine | Admitting: Emergency Medicine

## 2011-09-24 ENCOUNTER — Emergency Department (HOSPITAL_COMMUNITY): Payer: BC Managed Care – PPO

## 2011-09-24 DIAGNOSIS — F17200 Nicotine dependence, unspecified, uncomplicated: Secondary | ICD-10-CM | POA: Insufficient documentation

## 2011-09-24 DIAGNOSIS — S298XXA Other specified injuries of thorax, initial encounter: Secondary | ICD-10-CM | POA: Insufficient documentation

## 2011-09-24 DIAGNOSIS — W010XXA Fall on same level from slipping, tripping and stumbling without subsequent striking against object, initial encounter: Secondary | ICD-10-CM | POA: Insufficient documentation

## 2011-09-24 DIAGNOSIS — S2239XA Fracture of one rib, unspecified side, initial encounter for closed fracture: Secondary | ICD-10-CM

## 2011-09-24 DIAGNOSIS — I2510 Atherosclerotic heart disease of native coronary artery without angina pectoris: Secondary | ICD-10-CM | POA: Insufficient documentation

## 2011-09-24 DIAGNOSIS — E7800 Pure hypercholesterolemia, unspecified: Secondary | ICD-10-CM | POA: Insufficient documentation

## 2011-09-24 DIAGNOSIS — E119 Type 2 diabetes mellitus without complications: Secondary | ICD-10-CM | POA: Insufficient documentation

## 2011-09-24 DIAGNOSIS — W19XXXA Unspecified fall, initial encounter: Secondary | ICD-10-CM

## 2011-09-24 DIAGNOSIS — I10 Essential (primary) hypertension: Secondary | ICD-10-CM | POA: Insufficient documentation

## 2011-09-24 HISTORY — DX: Atherosclerotic heart disease of native coronary artery without angina pectoris: I25.10

## 2011-09-24 MED ORDER — OXYCODONE-ACETAMINOPHEN 5-325 MG PO TABS: 1.0000 | ORAL_TABLET | Freq: Four times a day (QID) | ORAL | Status: AC | PRN

## 2011-09-24 MED ORDER — KETOROLAC TROMETHAMINE 60 MG/2ML IM SOLN
60.0000 mg | Freq: Once | INTRAMUSCULAR | Status: AC
  Administered 2011-09-24: 60 mg via INTRAMUSCULAR
  Filled 2011-09-24: qty 2

## 2011-09-24 NOTE — ED Provider Notes (Signed)
History   This chart was scribed for [name] Lyons, MD by Clarita Crane. The patient was seen in room APA18/APA18. Patient's care was started at 1023.    CSN: [id number]  Arrival date & time 09/24/11  1023   First MD Initiated Contact with Patient 09/24/11 1055      Chief Complaint  Patient presents with  . Genia Hotter in the yard on Friday and started hurting in my "left side" pt points to left ribs.      (Consider location/radiation/quality/duration/timing/severity/associated sxs/prior treatment) HPI [name] is a 51 y.o. female who presents to the Emergency Department complaining of constant moderate left sided lateral rib pain with associated congestion onset 4 days ago after sustaining a fall in which patient tripped and fell backwards landing on her buttocks. Patient notes pain is aggravated with deep breathing and coughing. Denies LOC, head injury, weakness, SOB, nausea, vomiting, back pain, abdominal pain. Patient with h/o HTN, high cholesterol, diabetes, CAD and is a current smoker.   Past Medical History  Diagnosis Date  . Hypertension   . High cholesterol   . Diabetes mellitus   . Coronary artery disease     Past Surgical History  Procedure Date  . Cardiac catheterization   . Tubal ligation     No family history on file.  History  Substance Use Topics  . Smoking status: Current Everyday Smoker -- 0.5 packs/day  . Smokeless tobacco: Not on file  . Alcohol Use: No    OB History    Grav Para Term Preterm Abortions TAB SAB Ect Mult Living                  Review of Systems A complete 10 system review of systems was obtained and all systems are negative except as noted in the HPI and PMH.   Allergies  Erythromycin and Hydrocodone  Home Medications   Current Outpatient Rx  Name Route Sig Dispense Refill  . ALPRAZOLAM 0.25 MG PO TABS Oral Take 0.25 mg by mouth at bedtime.    . FENOFIBRATE MICRONIZED 134 MG PO CAPS Oral Take 134 mg by mouth daily.     Marland Kitchen LISINOPRIL 10 MG PO TABS Oral Take 10 mg by mouth daily.    Marland Kitchen METFORMIN HCL ER (MOD) 500 MG PO TB24 Oral Take 500 mg by mouth daily.    Marland Kitchen METOPROLOL SUCCINATE ER 25 MG PO TB24 Oral Take 12.5 mg by mouth daily.    Marland Kitchen OMEPRAZOLE 20 MG PO CPDR Oral Take 20 mg by mouth daily.    . VENLAFAXINE HCL 50 MG PO TABS Oral Take 100 mg by mouth daily.      BP 190/88  Pulse 109  Temp 98.3 F (36.8 C) (Oral)  Resp 18  LMP 06/26/2011  Physical Exam  Nursing note and vitals reviewed. Constitutional: She is oriented to person, place, and time. She appears well-developed and well-nourished. No distress.  HENT:  Head: Normocephalic and atraumatic.  Eyes: EOM are normal. Pupils are equal, round, and reactive to light.  Neck: Neck supple. No tracheal deviation present.  Cardiovascular: Normal rate and regular rhythm.  Exam reveals no gallop and no friction rub.   No murmur heard. Pulmonary/Chest: Effort normal. No respiratory distress. She has no wheezes. She has no rales.       Exquisite tenderness to left lateral ribs with no crepitance.   Abdominal: Soft. She exhibits no distension.  Musculoskeletal: Normal range of motion. She exhibits no  edema.       Spine non-tender.   Neurological: She is alert and oriented to person, place, and time. No sensory deficit.  Skin: Skin is warm and dry.  Psychiatric: She has a normal mood and affect. Her behavior is normal.    ED Course  Procedures (including critical care time)  DIAGNOSTIC STUDIES:  COORDINATION OF CARE: 10:45AM-Patient informed of current plan for treatment and evaluation and agrees with plan at this time.     Labs Reviewed - No data to display Dg Ribs Unilateral W/chest Left  09/24/2011  *RADIOLOGY REPORT*  Clinical Data: 51 year old female with left lateral rib pain after fall.  Pain with inspiration.  LEFT RIBS AND CHEST - 3+ VIEW  Comparison: 10/26/2010, 02/12/2007.  Findings: Stable lung volumes and evidence of mild  cardiomegaly. No pneumothorax or pleural effusion. Other mediastinal contours are within normal limits.  Visualized tracheal air column is within normal limits.  The lungs are clear.  A BB marker placed in the area of pain is located just inferior to the lower left ribs.  There is a subtle minimally-displaced left lateral ninth rib fracture.  No other displaced rib fracture identified. Bone mineralization is within normal limits.  No other osseous abnormality identified.  IMPRESSION: 1.  Minimally-displaced left lateral ninth rib fracture suspected. 2.  No pneumothorax, effusion, or acute cardiopulmonary abnormality.  Original Report Authenticated By: Harley Hallmark, M.D.     No diagnosis found.    MDM  Xrays reveal what appears to be a 9th rib fracture, but no ptx.  Will discharge to home with percocet, rest, time.  To return or follow up prn.       I personally performed the services described in this documentation, which was scribed in my presence. The recorded information has been reviewed and considered.    [name] Lyons, MD 09/24/11 1154

## 2011-09-24 NOTE — Discharge Instructions (Signed)
Rib Fracture Your caregiver has diagnosed you as having a rib fracture (a break). This can occur by a blow to the chest, by a fall against a hard object, or by violent coughing or sneezing. There may be one or many breaks. Rib fractures may heal on their own within 3 to 8 weeks. The longer healing period is usually associated with a continued cough or other aggravating activities. HOME CARE INSTRUCTIONS   Avoid strenuous activity. Be careful during activities and avoid bumping the injured rib. Activities that cause pain pull on the fracture site(s) and are best avoided if possible.   Eat a normal, well-balanced diet. Drink plenty of fluids to avoid constipation.   Take deep breaths several times a day to keep lungs free of infection. Try to cough several times a day, splinting the injured area with a pillow. This will help prevent pneumonia.   Do not wear a rib belt or binder. These restrict breathing which can lead to pneumonia.   Only take over-the-counter or prescription medicines for pain, discomfort, or fever as directed by your caregiver.  SEEK MEDICAL CARE IF:  You develop a continual cough, associated with thick or bloody sputum. SEEK IMMEDIATE MEDICAL CARE IF:   You have a fever.   You have difficulty breathing.   You have nausea (feeling sick to your stomach), vomiting, or abdominal (belly) pain.   You have worsening pain, not controlled with medications.  Document Released: 03/25/2005 Document Revised: 03/14/2011 Document Reviewed: 08/27/2006 [employer] Patient Information 2012 [employer]. 

## 2011-11-26 ENCOUNTER — Emergency Department (HOSPITAL_COMMUNITY)
Admission: EM | Admit: 2011-11-26 | Discharge: 2011-11-26 | Disposition: A | Discharge status: Other Acute Inpatient Hospital | Payer: BC Managed Care – PPO | Source: Home / Self Care | Attending: Emergency Medicine | Admitting: Emergency Medicine

## 2011-11-26 ENCOUNTER — Encounter (HOSPITAL_COMMUNITY)
Admission: EM | Disposition: A | Discharge status: Other Acute Inpatient Hospital | Payer: Self-pay | Source: Home / Self Care | Attending: Emergency Medicine

## 2011-11-26 ENCOUNTER — Encounter (HOSPITAL_COMMUNITY): Payer: Self-pay | Admitting: *Deleted

## 2011-11-26 ENCOUNTER — Emergency Department (HOSPITAL_COMMUNITY): Payer: BC Managed Care – PPO

## 2011-11-26 ENCOUNTER — Encounter (HOSPITAL_COMMUNITY)
Admission: RE | Disposition: A | Discharge status: Home / Self Care | Payer: Self-pay | Source: Ambulatory Visit | Attending: Cardiology

## 2011-11-26 ENCOUNTER — Encounter (HOSPITAL_COMMUNITY): Payer: Self-pay | Admitting: General Practice

## 2011-11-26 ENCOUNTER — Inpatient Hospital Stay (HOSPITAL_COMMUNITY)
Admission: RE | Admit: 2011-11-26 | Discharge: 2011-11-26 | DRG: 125 | Disposition: A | Discharge status: Home / Self Care | Payer: BC Managed Care – PPO | Source: Ambulatory Visit | Attending: Cardiology | Admitting: Cardiology

## 2011-11-26 DIAGNOSIS — S2239XA Fracture of one rib, unspecified side, initial encounter for closed fracture: Secondary | ICD-10-CM | POA: Diagnosis present

## 2011-11-26 DIAGNOSIS — F319 Bipolar disorder, unspecified: Secondary | ICD-10-CM

## 2011-11-26 DIAGNOSIS — F17200 Nicotine dependence, unspecified, uncomplicated: Secondary | ICD-10-CM | POA: Diagnosis present

## 2011-11-26 DIAGNOSIS — R0789 Other chest pain: Principal | ICD-10-CM | POA: Diagnosis present

## 2011-11-26 DIAGNOSIS — I214 Non-ST elevation (NSTEMI) myocardial infarction: Secondary | ICD-10-CM

## 2011-11-26 DIAGNOSIS — E7800 Pure hypercholesterolemia, unspecified: Secondary | ICD-10-CM | POA: Diagnosis present

## 2011-11-26 DIAGNOSIS — I10 Essential (primary) hypertension: Secondary | ICD-10-CM | POA: Diagnosis present

## 2011-11-26 DIAGNOSIS — J209 Acute bronchitis, unspecified: Secondary | ICD-10-CM | POA: Diagnosis present

## 2011-11-26 DIAGNOSIS — W19XXXA Unspecified fall, initial encounter: Secondary | ICD-10-CM | POA: Diagnosis present

## 2011-11-26 DIAGNOSIS — R079 Chest pain, unspecified: Secondary | ICD-10-CM | POA: Diagnosis present

## 2011-11-26 DIAGNOSIS — E119 Type 2 diabetes mellitus without complications: Secondary | ICD-10-CM | POA: Diagnosis present

## 2011-11-26 DIAGNOSIS — Z79899 Other long term (current) drug therapy: Secondary | ICD-10-CM

## 2011-11-26 DIAGNOSIS — J440 Chronic obstructive pulmonary disease with acute lower respiratory infection: Secondary | ICD-10-CM | POA: Diagnosis present

## 2011-11-26 DIAGNOSIS — I2510 Atherosclerotic heart disease of native coronary artery without angina pectoris: Secondary | ICD-10-CM | POA: Diagnosis present

## 2011-11-26 DIAGNOSIS — I739 Peripheral vascular disease, unspecified: Secondary | ICD-10-CM | POA: Diagnosis present

## 2011-11-26 HISTORY — DX: Bipolar disorder, unspecified: F31.9

## 2011-11-26 HISTORY — PX: LEFT HEART CATHETERIZATION WITH CORONARY ANGIOGRAM: SHX5451

## 2011-11-26 HISTORY — DX: Heart failure, unspecified: I50.9

## 2011-11-26 HISTORY — DX: Cardiac murmur, unspecified: R01.1

## 2011-11-26 HISTORY — DX: Anxiety disorder, unspecified: F41.9

## 2011-11-26 LAB — CBC
HCT: 36.6 % (ref 36.0–46.0)
Hemoglobin: 12.4 g/dL (ref 12.0–15.0)
MCH: 30.5 pg (ref 26.0–34.0)
RBC: 4.07 MIL/uL (ref 3.87–5.11)

## 2011-11-26 LAB — BASIC METABOLIC PANEL
CO2: 24 mEq/L (ref 19–32)
Calcium: 9.2 mg/dL (ref 8.4–10.5)
Glucose, Bld: 95 mg/dL (ref 70–99)
Potassium: 3.9 mEq/L (ref 3.5–5.1)
Sodium: 137 mEq/L (ref 135–145)

## 2011-11-26 LAB — POCT ACTIVATED CLOTTING TIME: Activated Clotting Time: 160 seconds

## 2011-11-26 LAB — GLUCOSE, CAPILLARY: Glucose-Capillary: 107 mg/dL — ABNORMAL HIGH (ref 70–99)

## 2011-11-26 LAB — POCT I-STAT TROPONIN I

## 2011-11-26 LAB — TROPONIN I: Troponin I: <0.3 ng/mL (ref <0.30)

## 2011-11-26 SURGERY — LEFT HEART CATHETERIZATION WITH CORONARY ANGIOGRAM
Anesthesia: LOCAL

## 2011-11-26 MED ORDER — FUROSEMIDE 10 MG/ML IJ SOLN
20.0000 mg | Freq: Once | INTRAMUSCULAR | Status: AC
  Administered 2011-11-26: 20 mg via INTRAMUSCULAR

## 2011-11-26 MED ORDER — SODIUM CHLORIDE 0.9 % IJ SOLN: 3.0000 mL | Freq: Two times a day (BID) | INTRAMUSCULAR | Status: DC

## 2011-11-26 MED ORDER — LIDOCAINE HCL (PF) 1 % IJ SOLN
INTRAMUSCULAR | Status: AC
  Filled 2011-11-26: qty 30

## 2011-11-26 MED ORDER — FENTANYL CITRATE 0.05 MG/ML IJ SOLN: 50.0000 ug | INTRAMUSCULAR | Status: DC | PRN

## 2011-11-26 MED ORDER — FUROSEMIDE 10 MG/ML IJ SOLN
INTRAMUSCULAR | Status: AC
  Filled 2011-11-26: qty 4

## 2011-11-26 MED ORDER — SODIUM CHLORIDE 0.9 % IV SOLN: 250.0000 mL | INTRAVENOUS | Status: DC | PRN

## 2011-11-26 MED ORDER — HEPARIN SODIUM (PORCINE) 5000 UNIT/ML IJ SOLN: 60.0000 [IU]/kg | INTRAMUSCULAR | Status: DC

## 2011-11-26 MED ORDER — MIDAZOLAM HCL 2 MG/2ML IJ SOLN
INTRAMUSCULAR | Status: AC
  Filled 2011-11-26: qty 2

## 2011-11-26 MED ORDER — ASPIRIN 81 MG PO CHEW: 324.0000 mg | CHEWABLE_TABLET | ORAL | Status: DC

## 2011-11-26 MED ORDER — AMOXICILLIN-POT CLAVULANATE 500-125 MG PO TABS: 1.0000 | ORAL_TABLET | Freq: Three times a day (TID) | ORAL | Status: AC

## 2011-11-26 MED ORDER — FENTANYL CITRATE 0.05 MG/ML IJ SOLN
INTRAMUSCULAR | Status: AC
  Filled 2011-11-26: qty 2

## 2011-11-26 MED ORDER — NITROGLYCERIN 0.2 MG/ML ON CALL CATH LAB
INTRAVENOUS | Status: AC
  Filled 2011-11-26: qty 1

## 2011-11-26 MED ORDER — ASPIRIN 325 MG PO TABS
325.0000 mg | ORAL_TABLET | ORAL | Status: AC
  Administered 2011-11-26: 325 mg via ORAL
  Filled 2011-11-26: qty 1

## 2011-11-26 MED ORDER — SODIUM CHLORIDE 0.9 % IV SOLN: INTRAVENOUS | Status: DC

## 2011-11-26 MED ORDER — HEPARIN (PORCINE) IN NACL 100-0.45 UNIT/ML-% IJ SOLN
700.0000 [IU]/h | INTRAMUSCULAR | Status: DC
  Administered 2011-11-26: 700 [IU]/h via INTRAVENOUS
  Filled 2011-11-26: qty 250

## 2011-11-26 MED ORDER — SODIUM CHLORIDE 0.9 % IJ SOLN: 3.0000 mL | INTRAMUSCULAR | Status: DC | PRN

## 2011-11-26 MED ORDER — NITROGLYCERIN 0.4 MG SL SUBL
0.4000 mg | SUBLINGUAL_TABLET | SUBLINGUAL | Status: DC | PRN
  Administered 2011-11-26 (×2): 0.4 mg via SUBLINGUAL
  Filled 2011-11-26: qty 25

## 2011-11-26 MED ORDER — ONDANSETRON HCL 4 MG/2ML IJ SOLN: 4.0000 mg | Freq: Four times a day (QID) | INTRAMUSCULAR | Status: DC | PRN

## 2011-11-26 MED ORDER — HEPARIN (PORCINE) IN NACL 100-0.45 UNIT/ML-% IJ SOLN: 12.0000 [IU]/kg/h | Freq: Once | INTRAMUSCULAR | Status: DC

## 2011-11-26 MED ORDER — HEPARIN BOLUS VIA INFUSION
3500.0000 [IU] | Freq: Once | INTRAVENOUS | Status: AC
  Administered 2011-11-26: 3500 [IU] via INTRAVENOUS

## 2011-11-26 MED ORDER — ACETAMINOPHEN 325 MG PO TABS: 650.0000 mg | ORAL_TABLET | ORAL | Status: DC | PRN

## 2011-11-26 MED ORDER — HEPARIN (PORCINE) IN NACL 2-0.9 UNIT/ML-% IJ SOLN
INTRAMUSCULAR | Status: AC
  Filled 2011-11-26: qty 2000

## 2011-11-26 MED ORDER — AMOXICILLIN-POT CLAVULANATE 500-125 MG PO TABS
1.0000 | ORAL_TABLET | Freq: Three times a day (TID) | ORAL | Status: DC
  Filled 2011-11-26 (×3): qty 1

## 2011-11-26 NOTE — ED Provider Notes (Signed)
I saw and evaluated the patient, reviewed the resident's note and I agree with the findings and plan.  Cheri Guppy, MD 11/26/11 [phone]

## 2011-11-26 NOTE — ED Notes (Signed)
Report given to Judeth Cornfield, RN in Cath Lab-[facility] to transport patient

## 2011-11-26 NOTE — ED Provider Notes (Signed)
I saw and evaluated the patient, reviewed the resident's note and I agree with the findings and plan. Pt has hx of cad.  She smoked until last week.   C/o left sided cp which is both sharp and squeezing ( described with hand gestures ).   sxs accompanied by DOE and sweating.   ECG shows new tw inversions and trop slightly incr.   Dx nonstemi vs unstable angina.   Dr. Jacinto Halim will take pt to cath lab.  CRITICAL CARE Performed by: Nicholes Stairs   Total critical care time: 30 min  Critical care time was exclusive of separately billable procedures and treating other patients.  Critical care was necessary to treat or prevent imminent or life-threatening deterioration.  Critical care was time spent personally by me on the following activities: development of treatment plan with patient and/or surrogate as well as nursing, discussions with consultants, evaluation of patient's response to treatment, examination of patient, obtaining history from patient or surrogate, ordering and performing treatments and interventions, ordering and review of laboratory studies, ordering and review of radiographic studies, pulse oximetry and re-evaluation of patient's condition.  Cheri Guppy, MD 11/26/11 [phone]

## 2011-11-26 NOTE — ED Notes (Signed)
Increased cough with chest pain

## 2011-11-26 NOTE — Progress Notes (Signed)
ANTICOAGULATION CONSULT NOTE - Initial Consult  Pharmacy Consult for heparin Indication: chest pain/ACS  Allergies  Allergen Reactions  . Erythromycin Rash  . Hydrocodone Itching    Patient Measurements:   Heparin Dosing Weight: 58 kg  Vital Signs: Temp: 98.9 F (37.2 C) (08/20 0818) Temp src: Oral (08/20 0818) BP: 186/86 mmHg (08/20 0818)  Labs:  Basename 11/26/11 0825  HGB 12.4  HCT 36.6  PLT 183  APTT --  LABPROT --  INR --  HEPARINUNFRC --  CREATININE 0.88  CKTOTAL --  CKMB --  TROPONINI --    The CrCl is unknown because both a height and weight (above a minimum accepted value) are required for this calculation.   Medical History: Past Medical History  Diagnosis Date  . Hypertension   . High cholesterol   . Diabetes mellitus   . Coronary artery disease     Medications:   (Not in a hospital admission) Scheduled:    . aspirin  325 mg Oral STAT  . heparin  3,500 Units Intravenous Once  . DISCONTD: heparin  12 Units/kg/hr Intravenous Once  . DISCONTD: heparin  60 Units/kg Intravenous STAT    Assessment: 12 YOF presents to ED with chest pain, she has previous h/o CAD with 2012 cath showing 60% stenosis of mid Cx. She also c/o cough and SOB/wheezing and recently given Levaquin at Urgent Care.  1st troponin level returned high normal/slightly elevated, orders to start heparin gtt.   Goal of Therapy:  Heparin level 0.3-0.7 units/ml Monitor platelets by anticoagulation protocol: Yes   Plan:  1. Heparin bolus 3500 units then heparin infusion at 700 units/hr 2. Check 6h heparin level 3. Daily heparin levels and CBC 4. Monitor for signs of bleeding  Dannielle Huh 11/26/2011,9:11 AM

## 2011-11-26 NOTE — Interval H&P Note (Signed)
History and Physical Interval Note:  11/26/2011 11:17 AM  [name]  has presented today for surgery, with the diagnosis of cp  The various methods of treatment have been discussed with the patient and family. After consideration of risks, benefits and other options for treatment, the patient has consented to  Procedure(s) (LRB): LEFT HEART CATHETERIZATION WITH CORONARY ANGIOGRAM (N/A) and possible angioplasty as a surgical intervention .  The patient's history has been reviewed, patient examined, no change in status, stable for surgery.  I have reviewed the patient's chart and labs.  Questions were answered to the patient's satisfaction.     Pamella Pert

## 2011-11-26 NOTE — ED Notes (Signed)
Pt presents with several days of chest pain, has been seen at Urgent Care, has appointment Friday with MD due to ? Heart looks like something is wrong, unsure what, shortness of breath and wheezing noted

## 2011-11-26 NOTE — H&P (Signed)
[name] is an 51 y.o. female.   Chief Complaint: Chest pain HPI: [name] is a 51 year old Caucasian female with history of diabetes, hypertension, hyperlipidemia and tobacco use who quit only 3 days ago after her husband was recently diagnosed with lung cancer was doing well until about a week ago when she developed chest pain. The chest pain is located in the left side of the chest and it was on and off lasting for several minutes a day. She had broken her ribs in June of 2013 after she has had a fall. She initially thought this was related to a fall and fracture. However because of worsening chest pain she was seen at an urgent care and treated with antibiotics and upper respiratory and bronchitis infection and advised to follow up with her physicians. Because of worsening chest pain and shortness of breath that got worse last night and this morning she presented to [facility] emergency department with her troponins were positive for myocardial injury. She was then transferred to [facility] for further evaluation and treatment including possible cardiac catheterization.   patient states that chest pain comes on and off with or  without exertional activity and is associated with diaphoresis. No associated nausea vomiting. She has been coughing and has had some productive cough but denies any fever. No hemoptysis or syncope. No lower extremity edema or painful leg.  Past Medical History  Diagnosis Date  . Hypertension   . High cholesterol   . Diabetes mellitus   . Coronary artery disease     Past Surgical History  Procedure Date  . Cardiac catheterization   . Tubal ligation     No family history on file. Social History:  reports that she has been smoking.  She does not have any smokeless tobacco history on file. She reports that she does not drink alcohol or use illicit drugs.  Allergies:  Allergies  Allergen Reactions  . Erythromycin Rash  . Hydrocodone  Itching    Medications Prior to Admission  Medication Sig Dispense Refill  . ALPRAZolam (XANAX) 0.25 MG tablet Take 0.5 mg by mouth at bedtime.       Marland Kitchen atorvastatin (LIPITOR) 40 MG tablet Take 40 mg by mouth every evening.      . fenofibrate micronized (LOFIBRA) 134 MG capsule Take 134 mg by mouth daily.      Marland Kitchen lisinopril (PRINIVIL,ZESTRIL) 10 MG tablet Take 10 mg by mouth daily.      . metFORMIN (GLUMETZA) 500 MG (MOD) 24 hr tablet Take 500 mg by mouth daily with breakfast.       . metoprolol succinate (TOPROL-XL) 25 MG 24 hr tablet Take 12.5 mg by mouth daily.      Marland Kitchen omeprazole (PRILOSEC) 20 MG capsule Take 20 mg by mouth daily.      Marland Kitchen venlafaxine (EFFEXOR) 50 MG tablet Take 100 mg by mouth at bedtime.         Results for orders placed during the hospital encounter of 11/26/11 (from the past 48 hour(s))  CBC     Status: Normal   Collection Time   11/26/11  8:25 AM      Component Value Range Comment   WBC 6.0  4.0 - 10.5 K/uL    RBC 4.07  3.87 - 5.11 MIL/uL    Hemoglobin 12.4  12.0 - 15.0 g/dL    HCT 04.5  40.9 - 81.1 %    MCV 89.9  78.0 - 100.0 fL  MCH 30.5  26.0 - 34.0 pg    MCHC 33.9  30.0 - 36.0 g/dL    RDW 21.3  08.6 - 57.8 %    Platelets 183  150 - 400 K/uL   BASIC METABOLIC PANEL     Status: Abnormal   Collection Time   11/26/11  8:25 AM      Component Value Range Comment   Sodium 137  135 - 145 mEq/L    Potassium 3.9  3.5 - 5.1 mEq/L    Chloride 102  96 - 112 mEq/L    CO2 24  19 - 32 mEq/L    Glucose, Bld 95  70 - 99 mg/dL    BUN 11  6 - 23 mg/dL    Creatinine, Ser 4.69  0.50 - 1.10 mg/dL    Calcium 9.2  8.4 - 62.9 mg/dL    GFR calc non Af Amer 74 (*) >90 mL/min    GFR calc Af Amer 86 (*) >90 mL/min   POCT I-STAT TROPONIN I     Status: Abnormal   Collection Time   11/26/11  8:34 AM      Component Value Range Comment   Troponin i, poc 0.09 (*) 0.00 - 0.08 ng/mL    Comment 3            TROPONIN I     Status: Normal   Collection Time   11/26/11  9:50 AM       Component Value Range Comment   Troponin I <0.30  <0.30 ng/mL    Dg Chest 2 View  11/26/2011  *RADIOLOGY REPORT*  Clinical Data: Chest pain  CHEST - 2 VIEW  Comparison: 09/24/2011  Findings: Lungs are clear. No pleural effusion or pneumothorax.  Cardiomediastinal silhouette is within normal limits.  Mild degenerative changes of the visualized thoracolumbar spine.  Healing left lateral ninth rib fracture.  IMPRESSION: Healing left lateral ninth rib fracture.   Original Report Authenticated By: Charline Bills, M.D.    Review of Systems - General ROS: negative for - chills, fatigue, fever, hot flashes or weight loss Endocrine ROS: positive for - diabetes controlled. negative for - hot flashes, palpitations, temperature intolerance or unexpected weight changes Respiratory ROS: positive for - cough, pleuritic pain, shortness of breath and sputum changes Cardiovascular ROS: positive for - chest pain, dyspnea on exertion and shortness of breath negative for - edema, irregular heartbeat or loss of consciousness    Last menstrual period 06/26/2011. General appearance: alert, cooperative, appears stated age and no distress Eyes: conjunctivae/corneas clear. PERRL, EOM's intact. Fundi benign. Neck: no adenopathy, no carotid bruit, no JVD, supple, symmetrical, trachea midline and thyroid not enlarged, symmetric, no tenderness/mass/nodules Neck: JVP - normal, carotids 2+= without bruits Resp: rhonchi bilaterally and diffuse. Left lateral ribs are tender. Chest wall: no tenderness, left sided chest wall tenderness Cardio: S1, S2 normal, no S3 or S4 and II/VI long systolic murmur inthe apex and II/VI SEM in right sternal border heard. GI: soft, non-tender; bowel sounds normal; no masses,  no organomegaly Extremities: extremities normal, atraumatic, no cyanosis or edema Pulses: 2+ and symmetric Skin: Skin color, texture, turgor normal. No rashes or lesions Neurologic: Alert and oriented X 3, normal  strength and tone. Normal symmetric reflexes. Normal coordination and gait   Assessment/Plan 1. Chest pain has both pleuritic component due to refracture and also has unstable angina qualities with associated worsening shortness of breath, diaphoresis, chest pain on and off. Nuclear stress 5.11.12 Anterolateral ischemia.  EF 25%.   High risk stress.   Heart cath 09/10/10 LVEF 80%. Intraventricular pressure gradient , Mid Circumflex 60% Mod Pul HTN. 2. Non-ST elevation MI with positive troponins. 3. Hypertension 4. Diabetes mellitus type 2 controlled 5. Hyperlipidemia.  6. Tobacco use disorder. Quit 3 days ago. 7. Acute on chronic bronchitis.  Recommendation: Given subtle EKG abnormalities that are evident now along with positive troponins, she is known coronary artery disease with a 60% circumflex coronary artery stenosis. We will proceed with cardiac catheterization. She understands risks, benefits, alternatives to cardiac catheterization including but not limited to less than 1% risk of that, stroke, MI, arch and CABG, bleeding, infection but not into this. I congratulated her on having quit smoking. Continue to reinforce. Further recommendation after cardiac catheterization   Pamella Pert, MD 11/26/2011, 10:46 AM

## 2011-11-26 NOTE — CV Procedure (Signed)
CARDIAC CATHETERIZATION   PROCEDURES PERFORMED:  1. Left ventriculography.  2. Selected right and left coronary arteriography.  3. Abdominal aortogram.   INDICATIONS: [name] is a pleasant 51 year old female  with history of known peripheral arterial disease, CAD with OM 1 60 % stenosis in June 2012  presented with chest pain and also shortness of breath. Initial set of cardiac markers were positive for troponins. Because of chest pain is ongoing she was brought to the cardiac catheter lab to evaluate coronary anatomy. Abdominal aortogram was performed to evaluate for renal artery stenosis as the blood pressure was extremely high at 227/114 mm mercury. She also has known peripheral arterial disease and has a right common iliac artery stent.  LEFT HEART CATHETERIZATION HEMODYNAMIC DATA:   1. Left ventricular pressure was236/21 with end-diastolic pressure of 31 mmHg.  2. Aortic pressure was 227/114 with a mean of 159 mmHg. THERE WAS A 10- mm Hg  INTRAVENTRICULAR PRESSURE GRADIENT WITHIN THE LEFT VENTRICLE.   ANGIOGRAPHIC DATA: Left ventricle. Left ventricular systolic function was hyperdynamic. Ejection fraction was estimated around 80-90%. There was dynamic left ventricular cavity collapse.   Right coronary artery. Right coronary artery is a large-caliber vessel and a dominant vessel. It gives origin to large PDA which has smooth and normal. Right coronary artery in the proximal segment has a very mild luminal irregularities.   Left main coronary artery: Left main coronary artery is a large-caliber  vessel. It is smooth and normal.   Circumflex. Circumflex coronary artery is a large-caliber vessel, smooth except at the AV groove branch. The circumflex itself has a smooth 40% stenoses. Obtuse marginal 1 has a 50-60% stenosis. The stenosis does not appear to have changed from prior cardiac catheterization on 09/10/2010 and in fact appears to be slightly improved.  LAD. LAD is a  large-caliber vessel. It has got mild 20-30% luminal  irregularity in the proximal-to-mid segment. It gives origin to 2 small- to-moderate sized diagonals and several smaller diagonals.   IMPRESSION:   1. One-vessel coronary artery disease involving the mid circumflex  40% and OM-1 coronary artery about 50-60% stenosed.   2. Hyperdynamic left ventricular systolic function. The ejection  fraction of 80-90% with intraventricular pressure gradient   RECOMMENDATIONS:  1. Medical therapy for single-vessel coronary artery disease is  indicated. I suspect her abnormal stress test to be secondary to  hyperdynamic left ventricle and hypertension with hypertensive  heart disease.  2. She needs negative chronotropic and inotropic agents to improve   A total of 90 mL of contrast was utilized for diagnostic angiography.   TECHNIQUE OF PROCEDURE: Under sterile precautions using a  6-French right femoral artery access,  left heart catheterization was performed. Left heart catheterization was performed using 40F MP-B2 catheter was advanced into the ascending aorta and LV and LV gram performed in the RAO projection.  selective left and right coronary arteriography was performed.  The catheter was then pulled out of the body over an  exchange length J-wire and the catheter was then pulled into the descending aorta. Abdominal aortogram performed.  The catheter then pulled out of the body over a J-wire.  Hemostasis was obtained by applying perclose with excellent hemostasis. The  patient tolerated the procedure with no immediate complications.

## 2011-11-26 NOTE — Discharge Summary (Signed)
Physician Discharge Summary  Patient ID: [name] MRN: [id number] DOB/AGE: 1960-05-12 51 y.o.  Admit date: 11/26/2011 Discharge date: 11/26/2011  Primary Discharge Diagnosis Chest pain due to musculoskeletal etiology, left rib fractures and do to acute exacerbation in COPD  Secondary Discharge Diagnosis COPD with acute exacerbation Hypertension Diabetes mellitus type 2 controlled Hyperlipidemia Tobacco use disorder. Patient with smoking cigarettes 3 days ago.  Significant Diagnostic Studies: Patient was initially seen in the emergency department at [facility]. She was transferred her to [facility] for further evaluation of her chest pain. Facet of cardiac markers had revealed positive troponins however subsequent troponins were negative for myocardial injury. However due to persistent chest discomfort and EKG showing minimal worsening in inversion in lateral leads and known coronary artery disease and multiple cardiovascular risk factors she underwent cardiac catheterization which revealed mid circumflex 30-40% stenosis and 8 obtuse marginal 1 showed a 50% stenosis unchanged from prior cardiac catheterization in June of 2012. She had hyperdynamic left ventricle. Ejection fraction is around 80-90%. She was also hypertensive. There is no evidence of renal artery stenosis during cardiac catheterization.  Consults:   Hospital Course: And rule out significant coronary artery disease and any unstable plaque, negative cardiac markers, history of flair and a refracture, physical examination consistent with diffuse rhonchi in the lungs suggestive of acute COPD exacerbation, patient was felt stable and safe for discharge with antibiotic therapy for 5 days.   Discharge Exam: Last menstrual period 06/26/2011.   General appearance: alert, cooperative, appears stated age and no distress  Eyes: conjunctivae/corneas clear. PERRL, EOM's intact. Fundi benign.  Neck: no adenopathy,  no carotid bruit, no JVD, supple, symmetrical, trachea midline and thyroid not enlarged, symmetric, no tenderness/mass/nodules  Neck: JVP - normal, carotids 2+= without bruits  Resp: rhonchi bilaterally and diffuse. Left lateral ribs are tender.  Chest wall: no tenderness, left sided chest wall tenderness  Cardio: S1, S2 normal, no S3 or S4 and II/VI long systolic murmur inthe apex and II/VI SEM in right sternal border heard.  GI: soft, non-tender; bowel sounds normal; no masses, no organomegaly  Extremities: extremities normal, atraumatic, no cyanosis or edema  Pulses: 2+ and symmetric  Skin: Skin color, texture, turgor normal. No rashes or lesions  Neurologic: Alert and oriented X 3, normal strength and tone. Normal symmetric reflexes. Normal coordination and gait   Labs:   Lab Results  Component Value Date   WBC 6.0 11/26/2011   HGB 12.4 11/26/2011   HCT 36.6 11/26/2011   MCV 89.9 11/26/2011   PLT 183 11/26/2011    Lab 11/26/11 0825  NA 137  K 3.9  CL 102  CO2 24  BUN 11  CREATININE 0.88  CALCIUM 9.2  PROT --  BILITOT --  ALKPHOS --  ALT --  AST --  GLUCOSE 95   Lab Results  Component Value Date   CKTOTAL 102 10/26/2010   CKMB 6.9* 10/26/2010   TROPONINI <0.30 11/26/2011    Lipid Panel     Component Value Date/Time   CHOL  Value: 207        ATP III CLASSIFICATION:  <200     mg/dL   Desirable  409-811  mg/dL   Borderline High  >=914    mg/dL   High* 78/05/9560 1308   TRIG 145 02/13/2007 0630   HDL 27* 02/13/2007 0630   CHOLHDL 7.7 02/13/2007 0630   VLDL 29 02/13/2007 0630   LDLCALC  Value: 151  Total Cholesterol/HDL:CHD Risk Coronary Heart Disease Risk Table                     Men   Women  1/2 Average Risk   3.4   3.3* 02/13/2007 0630     Radiology: Dg Chest 2 View  11/26/2011  *RADIOLOGY REPORT*  Clinical Data: Chest pain  CHEST - 2 VIEW  Comparison: 09/24/2011  Findings: Lungs are clear. No pleural effusion or pneumothorax.  Cardiomediastinal silhouette is within  normal limits.  Mild degenerative changes of the visualized thoracolumbar spine.  Healing left lateral ninth rib fracture.  IMPRESSION: Healing left lateral ninth rib fracture.   Original Report Authenticated By: Charline Bills, M.D.     EKG: Normal sinus rhythm, normal axis, left hypertrophy with repolarization abnormality. Cannot rule out inferolateral ischemia.  FOLLOW UP PLANS AND APPOINTMENTS  Medication List  As of 11/26/2011  1:12 PM   TAKE these medications         ALPRAZolam 0.25 MG tablet   Commonly known as: XANAX   Take 0.5 mg by mouth at bedtime.      amoxicillin-clavulanate 500-125 MG per tablet   Commonly known as: AUGMENTIN   Take 1 tablet (500 mg total) by mouth every 8 (eight) hours.      atorvastatin 40 MG tablet   Commonly known as: LIPITOR   Take 40 mg by mouth every evening.      fenofibrate micronized 134 MG capsule   Commonly known as: LOFIBRA   Take 134 mg by mouth daily.      lisinopril 10 MG tablet   Commonly known as: PRINIVIL,ZESTRIL   Take 10 mg by mouth daily.      metFORMIN 500 MG (MOD) 24 hr tablet   Commonly known as: GLUMETZA   Take 500 mg by mouth daily with breakfast.      metoprolol succinate 25 MG 24 hr tablet   Commonly known as: TOPROL-XL   Take 12.5 mg by mouth daily.      omeprazole 20 MG capsule   Commonly known as: PRILOSEC   Take 20 mg by mouth daily.      venlafaxine 50 MG tablet   Commonly known as: EFFEXOR   Take 100 mg by mouth at bedtime.           Follow-up Information    Follow up with Pamella Pert, MD. Call in 2 days.   Contact information:   [address]  [place] [address] [phone]           Pamella Pert, MD 11/26/2011, 1:12 PM

## 2011-11-26 NOTE — ED Provider Notes (Signed)
History     CSN: [id number]  Arrival date & time 11/26/11  4098   First MD Initiated Contact with Patient 11/26/11 [phone]      Chief Complaint  Patient presents with  . Chest Pain    HPI 51 yo female with h/o CAD (cath 2012: 60% stenosis of mid circumflex coronary), DM2, Hyperlipidemia, HTN who presents with 1 week history of cough, increased shortness of breath and chest pain. She feels pressure constantly with intermittent sharp shooting pain that lasts for a few minutes, doesn't radiate. Chest pain started a week ago, is worst with coughing and breathing. Pain is located under left breast. Chest pain is not associated with nausea, vomiting or diaphoresis. She reports increased dyspnea on exertion and at rest. 2 pillow orthopnea.  She was seen at urgent care and was prescribed Levaquin for 10 days. Smokes less than 1 pack per day.  Also reports urinary incontinence every time she stands up. No dysuria or polyuria. Has had two children.   Past Medical History  Diagnosis Date  . Hypertension   . High cholesterol   . Diabetes mellitus   . Coronary artery disease     Past Surgical History  Procedure Date  . Cardiac catheterization   . Tubal ligation     No family history on file.  History  Substance Use Topics  . Smoking status: Current Everyday Smoker -- 0.5 packs/day  . Smokeless tobacco: Not on file  . Alcohol Use: No    Review of Systems  All other systems reviewed and are negative.   Allergies  Erythromycin and Hydrocodone  Home Medications   Current Outpatient Rx  Name Route Sig Dispense Refill  . ALPRAZOLAM 0.25 MG PO TABS Oral Take 0.5 mg by mouth at bedtime.     . ATORVASTATIN CALCIUM 40 MG PO TABS Oral Take 40 mg by mouth every evening.    . FENOFIBRATE MICRONIZED 134 MG PO CAPS Oral Take 134 mg by mouth daily.    Marland Kitchen LISINOPRIL 10 MG PO TABS Oral Take 10 mg by mouth daily.    Marland Kitchen METFORMIN HCL ER (MOD) 500 MG PO TB24 Oral Take 500 mg by mouth daily with  breakfast.     . METOPROLOL SUCCINATE ER 25 MG PO TB24 Oral Take 12.5 mg by mouth daily.    Marland Kitchen OMEPRAZOLE 20 MG PO CPDR Oral Take 20 mg by mouth daily.    . VENLAFAXINE HCL 50 MG PO TABS Oral Take 100 mg by mouth at bedtime.       BP 186/86  Temp 98.9 F (37.2 C) (Oral)  Resp 16  SpO2 98%  LMP 06/26/2011  Physical Exam  Constitutional: She is oriented to person, place, and time.       In acute distress to the point of tears when pain starts.   HENT:  Head: Normocephalic and atraumatic.  Eyes: Conjunctivae and EOM are normal. Pupils are equal, round, and reactive to light.  Neck: Normal range of motion. Neck supple. No thyromegaly present.  Cardiovascular: Normal rate, regular rhythm and normal heart sounds.   Pulmonary/Chest:       Rhonchus breath sounds with scattered expiratory breath sounds diffusely.   Abdominal: Soft. Bowel sounds are normal. She exhibits no distension. There is no tenderness.  Musculoskeletal: She exhibits no edema.  Neurological: She is alert and oriented to person, place, and time. No cranial nerve deficit.  Skin: Skin is warm and dry.  Psychiatric: She has a normal mood  and affect. Her behavior is normal.    ED Course  Procedures (including critical care time)  Date: 11/26/2011  Rate: 83  Rhythm: normal sinus rhythm  QRS Axis: normal  Intervals: normal  ST/T Wave abnormalities: T wave inversion in inferior leads, changed from previous  Conduction Disutrbances: none  Old EKG Reviewed: 10/26/10    Labs Reviewed  BASIC METABOLIC PANEL - Abnormal; Notable for the following:    GFR calc non Af Amer 74 (*)     GFR calc Af Amer 86 (*)     All other components within normal limits  POCT I-STAT TROPONIN I - Abnormal; Notable for the following:    Troponin i, poc 0.09 (*)     All other components within normal limits  CBC  URINALYSIS, ROUTINE W REFLEX MICROSCOPIC  TROPONIN I   Dg Chest 2 View  11/26/2011  *RADIOLOGY REPORT*  Clinical Data: Chest  pain  CHEST - 2 VIEW  Comparison: 09/24/2011  Findings: Lungs are clear. No pleural effusion or pneumothorax.  Cardiomediastinal silhouette is within normal limits.  Mild degenerative changes of the visualized thoracolumbar spine.  Healing left lateral ninth rib fracture.  IMPRESSION: Healing left lateral ninth rib fracture.   Original Report Authenticated By: Charline Bills, M.D.      1. Non-STEMI (non-ST elevated myocardial infarction)       MDM  Mild elevation in troponin in the context of CAD: heparin drip started with nitro for active chest pain. called Dr. Nadara Eaton who will perform cath today at [facility].  CXR showing cardiomegaly but no sign of infection.   Marena Chancy, PGY-2 [facility]        Lonia Skinner, MD 11/26/11 [phone]

## 2011-11-26 NOTE — ED Notes (Signed)
Patient states she broke rib in June, started having left sided pain and assumed it was rib pain-went to Urgent Care and had chest x-ray and found possible enlarged heart-PCP appt Friday-could not wait due to increased discomfort

## 2011-11-27 NOTE — Care Management (Signed)
Patient discharged yesterday. No status documented. Dr. Jacky Kindle reviewed status yesterday, decision was inpatient.

## 2012-01-16 ENCOUNTER — Other Ambulatory Visit: Payer: Self-pay | Admitting: Cardiology

## 2012-01-16 DIAGNOSIS — E041 Nontoxic single thyroid nodule: Secondary | ICD-10-CM

## 2012-01-21 ENCOUNTER — Ambulatory Visit: Payer: BC Managed Care – PPO | Admitting: Licensed Clinical Social Worker

## 2012-01-23 ENCOUNTER — Ambulatory Visit
Admission: RE | Admit: 2012-01-23 | Discharge: 2012-01-23 | Disposition: A | Discharge status: Home / Self Care | Payer: BC Managed Care – PPO | Source: Ambulatory Visit | Attending: Cardiology | Admitting: Cardiology

## 2012-01-23 DIAGNOSIS — E041 Nontoxic single thyroid nodule: Secondary | ICD-10-CM

## 2012-01-27 ENCOUNTER — Other Ambulatory Visit: Payer: Self-pay | Admitting: Nurse Practitioner

## 2012-01-27 DIAGNOSIS — E041 Nontoxic single thyroid nodule: Secondary | ICD-10-CM

## 2012-01-30 ENCOUNTER — Ambulatory Visit: Payer: BC Managed Care – PPO | Admitting: Licensed Clinical Social Worker

## 2012-02-04 ENCOUNTER — Ambulatory Visit
Admission: RE | Admit: 2012-02-04 | Discharge: 2012-02-04 | Disposition: A | Discharge status: Home / Self Care | Payer: BC Managed Care – PPO | Source: Ambulatory Visit | Attending: Nurse Practitioner | Admitting: Nurse Practitioner

## 2012-02-04 ENCOUNTER — Other Ambulatory Visit (HOSPITAL_COMMUNITY)
Admission: RE | Admit: 2012-02-04 | Discharge: 2012-02-04 | Disposition: A | Discharge status: Home / Self Care | Payer: BC Managed Care – PPO | Source: Ambulatory Visit | Attending: Diagnostic Radiology | Admitting: Diagnostic Radiology

## 2012-02-04 DIAGNOSIS — E041 Nontoxic single thyroid nodule: Secondary | ICD-10-CM

## 2012-02-04 DIAGNOSIS — E049 Nontoxic goiter, unspecified: Secondary | ICD-10-CM | POA: Insufficient documentation

## 2012-02-06 ENCOUNTER — Ambulatory Visit: Payer: BC Managed Care – PPO | Admitting: Licensed Clinical Social Worker

## 2012-02-20 ENCOUNTER — Ambulatory Visit (INDEPENDENT_AMBULATORY_CARE_PROVIDER_SITE_OTHER): Payer: BC Managed Care – PPO | Admitting: Licensed Clinical Social Worker

## 2012-02-20 DIAGNOSIS — IMO0002 Reserved for concepts with insufficient information to code with codable children: Secondary | ICD-10-CM

## 2012-03-04 ENCOUNTER — Ambulatory Visit: Payer: BC Managed Care – PPO | Admitting: Licensed Clinical Social Worker

## 2012-03-19 ENCOUNTER — Ambulatory Visit (INDEPENDENT_AMBULATORY_CARE_PROVIDER_SITE_OTHER): Payer: BC Managed Care – PPO | Admitting: Licensed Clinical Social Worker

## 2012-03-19 DIAGNOSIS — IMO0002 Reserved for concepts with insufficient information to code with codable children: Secondary | ICD-10-CM

## 2012-04-14 ENCOUNTER — Ambulatory Visit: Payer: BC Managed Care – PPO | Admitting: Licensed Clinical Social Worker

## 2012-04-21 ENCOUNTER — Ambulatory Visit: Payer: BC Managed Care – PPO | Admitting: Licensed Clinical Social Worker

## 2012-06-09 ENCOUNTER — Ambulatory Visit: Payer: BC Managed Care – PPO | Admitting: Licensed Clinical Social Worker

## 2012-06-16 ENCOUNTER — Telehealth: Payer: Self-pay | Admitting: Medical Oncology

## 2012-06-16 NOTE — Telephone Encounter (Signed)
erroneous

## 2012-09-24 ENCOUNTER — Ambulatory Visit: Payer: BC Managed Care – PPO | Admitting: Licensed Clinical Social Worker

## 2013-04-27 ENCOUNTER — Ambulatory Visit (INDEPENDENT_AMBULATORY_CARE_PROVIDER_SITE_OTHER): Payer: 59 | Admitting: Licensed Clinical Social Worker

## 2013-04-27 DIAGNOSIS — F331 Major depressive disorder, recurrent, moderate: Secondary | ICD-10-CM

## 2013-04-29 ENCOUNTER — Ambulatory Visit: Payer: Medicare Other | Admitting: Licensed Clinical Social Worker

## 2013-05-06 ENCOUNTER — Ambulatory Visit (INDEPENDENT_AMBULATORY_CARE_PROVIDER_SITE_OTHER): Payer: 59 | Admitting: Licensed Clinical Social Worker

## 2013-05-06 DIAGNOSIS — Z634 Disappearance and death of family member: Secondary | ICD-10-CM

## 2013-05-06 DIAGNOSIS — F331 Major depressive disorder, recurrent, moderate: Secondary | ICD-10-CM

## 2013-05-13 ENCOUNTER — Ambulatory Visit (INDEPENDENT_AMBULATORY_CARE_PROVIDER_SITE_OTHER): Payer: 59 | Admitting: Licensed Clinical Social Worker

## 2013-05-13 DIAGNOSIS — IMO0002 Reserved for concepts with insufficient information to code with codable children: Secondary | ICD-10-CM

## 2013-06-01 ENCOUNTER — Ambulatory Visit: Payer: 59 | Admitting: Licensed Clinical Social Worker

## 2013-06-08 ENCOUNTER — Ambulatory Visit (INDEPENDENT_AMBULATORY_CARE_PROVIDER_SITE_OTHER): Payer: 59 | Admitting: Licensed Clinical Social Worker

## 2013-06-08 DIAGNOSIS — IMO0002 Reserved for concepts with insufficient information to code with codable children: Secondary | ICD-10-CM

## 2013-06-15 ENCOUNTER — Ambulatory Visit: Payer: 59 | Admitting: Licensed Clinical Social Worker

## 2013-06-22 ENCOUNTER — Encounter (HOSPITAL_COMMUNITY): Payer: Self-pay | Admitting: Emergency Medicine

## 2013-06-22 ENCOUNTER — Emergency Department (HOSPITAL_COMMUNITY)
Admission: EM | Admit: 2013-06-22 | Discharge: 2013-06-22 | Disposition: A | Discharge status: Home / Self Care | Payer: Medicare Other | Attending: Emergency Medicine | Admitting: Emergency Medicine

## 2013-06-22 ENCOUNTER — Ambulatory Visit: Payer: 59 | Admitting: Licensed Clinical Social Worker

## 2013-06-22 DIAGNOSIS — R011 Cardiac murmur, unspecified: Secondary | ICD-10-CM | POA: Insufficient documentation

## 2013-06-22 DIAGNOSIS — I2510 Atherosclerotic heart disease of native coronary artery without angina pectoris: Secondary | ICD-10-CM | POA: Insufficient documentation

## 2013-06-22 DIAGNOSIS — R739 Hyperglycemia, unspecified: Secondary | ICD-10-CM

## 2013-06-22 DIAGNOSIS — I509 Heart failure, unspecified: Secondary | ICD-10-CM | POA: Insufficient documentation

## 2013-06-22 DIAGNOSIS — Z79899 Other long term (current) drug therapy: Secondary | ICD-10-CM | POA: Insufficient documentation

## 2013-06-22 DIAGNOSIS — Z9889 Other specified postprocedural states: Secondary | ICD-10-CM | POA: Insufficient documentation

## 2013-06-22 DIAGNOSIS — Z8659 Personal history of other mental and behavioral disorders: Secondary | ICD-10-CM | POA: Insufficient documentation

## 2013-06-22 DIAGNOSIS — F17200 Nicotine dependence, unspecified, uncomplicated: Secondary | ICD-10-CM | POA: Insufficient documentation

## 2013-06-22 DIAGNOSIS — I10 Essential (primary) hypertension: Secondary | ICD-10-CM | POA: Insufficient documentation

## 2013-06-22 DIAGNOSIS — E119 Type 2 diabetes mellitus without complications: Secondary | ICD-10-CM | POA: Insufficient documentation

## 2013-06-22 LAB — BASIC METABOLIC PANEL WITH GFR
BUN: 13 mg/dL (ref 6–23)
CO2: 26 meq/L (ref 19–32)
Calcium: 9.8 mg/dL (ref 8.4–10.5)
Chloride: 95 meq/L — ABNORMAL LOW (ref 96–112)
Creatinine, Ser: 0.89 mg/dL (ref 0.50–1.10)
GFR calc Af Amer: 84 mL/min — ABNORMAL LOW
GFR calc non Af Amer: 72 mL/min — ABNORMAL LOW
Glucose, Bld: 354 mg/dL — ABNORMAL HIGH (ref 70–99)
Potassium: 4.9 meq/L (ref 3.7–5.3)
Sodium: 134 meq/L — ABNORMAL LOW (ref 137–147)

## 2013-06-22 LAB — CBC WITH DIFFERENTIAL/PLATELET
BASOS ABS: 0 10*3/uL (ref 0.0–0.1)
BASOS PCT: 0 % (ref 0–1)
EOS PCT: 1 % (ref 0–5)
Eosinophils Absolute: 0 10*3/uL (ref 0.0–0.7)
HEMATOCRIT: 38.9 % (ref 36.0–46.0)
HEMOGLOBIN: 13.4 g/dL (ref 12.0–15.0)
LYMPHS PCT: 31 % (ref 12–46)
Lymphs Abs: 2 10*3/uL (ref 0.7–4.0)
MCH: 30.8 pg (ref 26.0–34.0)
MCHC: 34.4 g/dL (ref 30.0–36.0)
MCV: 89.4 fL (ref 78.0–100.0)
MONO ABS: 0.4 10*3/uL (ref 0.1–1.0)
MONOS PCT: 7 % (ref 3–12)
NEUTROS ABS: 3.9 10*3/uL (ref 1.7–7.7)
Neutrophils Relative %: 61 % (ref 43–77)
Platelets: 202 10*3/uL (ref 150–400)
RBC: 4.35 MIL/uL (ref 3.87–5.11)
RDW: 13.1 % (ref 11.5–15.5)
WBC: 6.3 10*3/uL (ref 4.0–10.5)

## 2013-06-22 LAB — CBG MONITORING, ED: Glucose-Capillary: 325 mg/dL — ABNORMAL HIGH (ref 70–99)

## 2013-06-22 MED ORDER — METFORMIN HCL 500 MG PO TABS: 500.0000 mg | ORAL_TABLET | Freq: Every day | ORAL | Status: DC

## 2013-06-22 NOTE — ED Notes (Signed)
Pt c/o nausea since 4pm and diaphoretic.  EMS reports bp 190/110.  CBG was 463 per ems.  EMS administered bolus NSS enroute.  Pt alert and oriented.  C/O chronic pain in left ribs area from old rib fracture.

## 2013-06-22 NOTE — Discharge Instructions (Signed)
You must take your metformin daily. Followup your primary care Dr.

## 2013-06-22 NOTE — ED Notes (Signed)
Pt reports has been without metformin for approx 1 month due to insurance reasons.  Also reports has been having problems with urinary incontinence since October.

## 2013-06-22 NOTE — ED Provider Notes (Signed)
CSN: [id number]     Arrival date & time 06/22/13  1818 History  This chart was scribed for Donnetta HutchingBrian Fouad Taul, MD by Bennett Scrapehristina Taylor, ED Scribe. This patient was seen in room APA17/APA17 and the patient's care was started at 6:38 PM.   Chief Complaint  Patient presents with  . Hyperglycemia     The history is provided by the patient. No language interpreter was used.    HPI Comments: [name] is a 53 y.o. female with a h/o DM who presents to the Emergency Department by ambulance complaining of one episode of sudden onset, persistent nausea with associated diaphoresis attributed to hyperglycemia noted around 4 PM this afternoon (2.5 hours ago). Pt states that she was doing some yard work today. She had to rest at frequent intervals but states that this is normal due to her h/o CHF. She was sitting on her porch afterwards when she developed the nausea and diaphoresis. She states that she went to her mother's and had a 442 CBG. At baseline, she usually runs around 100. CBG was 463 per EMS. She reports that she takes Metformin 500 mg X1 in the morning but has been out of her prescriptions for metformin and effexor. She states that her insurance won't cover the costs until April 1st. She was on [employer] Kod[employer]band's insurance but he [place] last [place] from [place] and is unable to afford them without the insurance. She denies any other sxs currently.    Past Medical History  Diagnosis Date  . Hypertension   . High cholesterol   . Diabetes mellitus   . Coronary artery disease   . Heart murmur   . CHF (congestive heart failure) 2010  . Bipolar 1 disorder 11/26/2011    "not on medicine"  . Anxiety    Past Surgical History  Procedure Laterality Date  . Tubal ligation    . Cardiac catheterization  ~ 2011; 11/26/2011   No family history on file. History  Substance Use Topics  . Smoking status: Current Every Day Smoker -- 0.50 packs/day    Types: Cigarettes    Last Attempt to Quit: 11/23/2011  . Smokeless  tobacco: Never Used  . Alcohol Use: No   No OB history provided.  Review of Systems  A complete 10 system review of systems was obtained and all systems are negative except as noted in the HPI and PMH.    Allergies  Hydrocodone and Erythromycin  Home Medications   Current Outpatient Rx  Name  Route  Sig  Dispense  Refill  . lisinopril (PRINIVIL,ZESTRIL) 10 MG tablet   Oral   Take 10 mg by mouth at bedtime.          . metFORMIN (GLUCOPHAGE) 500 MG tablet   Oral   Take 500 mg by mouth 2 (two) times a week.         . metFORMIN (GLUCOPHAGE) 500 MG tablet   Oral   Take 1 tablet (500 mg total) by mouth daily with breakfast.   30 tablet   1   . metoprolol succinate (TOPROL-XL) 25 MG 24 hr tablet   Oral   Take 12.5 mg by mouth daily.          Triage Vitals: BP 143/81  Pulse 84  Temp(Src) 98.2 F (36.8 C) (Oral)  Resp 16  Ht 4\' 10"  (1.473 m)  Wt 151 lb (68.493 kg)  BMI 31.57 kg/m2  SpO2 100%  LMP 06/26/2011  Physical Exam  Nursing note and vitals reviewed.  Constitutional: She is oriented to person, place, and time. She appears well-developed and well-nourished.  HENT:  Head: Normocephalic and atraumatic.  Eyes: Conjunctivae and EOM are normal. Pupils are equal, round, and reactive to light.  Neck: Normal range of motion. Neck supple.  Cardiovascular: Normal rate, regular rhythm and normal heart sounds.   Pulmonary/Chest: Effort normal and breath sounds normal.  Abdominal: Soft. Bowel sounds are normal.  Musculoskeletal: Normal range of motion.  Neurological: She is alert and oriented to person, place, and time.  Skin: Skin is warm and dry.  Psychiatric: She has a normal mood and affect. Her behavior is normal.    ED Course  Procedures (including critical care time)  DIAGNOSTIC STUDIES: Oxygen Saturation is 100% on RA, normal by my interpretation.    COORDINATION OF CARE: 6:46 PM-Discussed treatment plan which includes hyperglycemia tx and observation  with pt at bedside and pt agreed to plan.   Labs Review Labs Reviewed  BASIC METABOLIC PANEL - Abnormal; Notable for the following:    Sodium 134 (*)    Chloride 95 (*)    Glucose, Bld 354 (*)    GFR calc non Af Amer 72 (*)    GFR calc Af Amer 84 (*)    All other components within normal limits  CBG MONITORING, ED - Abnormal; Notable for the following:    Glucose-Capillary 325 (*)    All other components within normal limits  CBC WITH DIFFERENTIAL   Imaging Review No results found.   EKG Interpretation   Date/Time:  Tuesday June 22 2013 18:27:19 EDT Ventricular Rate:  92 PR Interval:  160 QRS Duration: 94 QT Interval:  380 QTC Calculation: 469 R Axis:   45 Text Interpretation:  Normal sinus rhythm with sinus arrhythmia Possible  Left atrial enlargement Left ventricular hypertrophy T wave abnormality,  consider inferolateral ischemia Prolonged QT Abnormal ECG Confirmed by  Adriana Simas  MD, Laresa Oshiro ([id number]) on 06/22/2013 6:50:44 PM      MDM   Final diagnoses:  Hyperglycemia   patient has not been taking her metformin. She is not in DKA. Hemodynamically stable. She will start taking her metformin medication.  I personally performed the services described in this documentation, which was scribed in my presence. The recorded information has been reviewed and is accurate.     Donnetta Hutching, MD 06/22/13 2033

## 2013-06-22 NOTE — ED Notes (Signed)
Pt reports increased blood glucose which she first noticed today. Pt states CBG was greater than 450 at home. Pt received bolus via EMS pta. Pt reports onset of nausea earlier this afternoon but denies any other symptoms.

## 2013-06-29 ENCOUNTER — Ambulatory Visit: Payer: 59 | Admitting: Licensed Clinical Social Worker

## 2013-07-19 ENCOUNTER — Ambulatory Visit (HOSPITAL_COMMUNITY): Payer: Self-pay | Admitting: Psychiatry

## 2013-09-16 ENCOUNTER — Encounter (HOSPITAL_COMMUNITY): Payer: Self-pay | Admitting: Pharmacy Technician

## 2013-09-21 ENCOUNTER — Ambulatory Visit (HOSPITAL_COMMUNITY)
Admission: RE | Admit: 2013-09-21 | Discharge: 2013-09-21 | Disposition: A | Discharge status: Home / Self Care | Payer: Medicare Other | Source: Ambulatory Visit | Attending: Cardiology | Admitting: Cardiology

## 2013-09-21 ENCOUNTER — Encounter (HOSPITAL_COMMUNITY)
Admission: RE | Disposition: A | Discharge status: Home / Self Care | Payer: Self-pay | Source: Ambulatory Visit | Attending: Cardiology

## 2013-09-21 DIAGNOSIS — I739 Peripheral vascular disease, unspecified: Secondary | ICD-10-CM | POA: Insufficient documentation

## 2013-09-21 DIAGNOSIS — I2510 Atherosclerotic heart disease of native coronary artery without angina pectoris: Secondary | ICD-10-CM | POA: Insufficient documentation

## 2013-09-21 DIAGNOSIS — F17200 Nicotine dependence, unspecified, uncomplicated: Secondary | ICD-10-CM | POA: Insufficient documentation

## 2013-09-21 DIAGNOSIS — I2584 Coronary atherosclerosis due to calcified coronary lesion: Secondary | ICD-10-CM | POA: Insufficient documentation

## 2013-09-21 DIAGNOSIS — I10 Essential (primary) hypertension: Secondary | ICD-10-CM | POA: Insufficient documentation

## 2013-09-21 DIAGNOSIS — E785 Hyperlipidemia, unspecified: Secondary | ICD-10-CM | POA: Insufficient documentation

## 2013-09-21 DIAGNOSIS — R0789 Other chest pain: Secondary | ICD-10-CM | POA: Insufficient documentation

## 2013-09-21 DIAGNOSIS — R9439 Abnormal result of other cardiovascular function study: Secondary | ICD-10-CM | POA: Insufficient documentation

## 2013-09-21 HISTORY — PX: LEFT HEART CATHETERIZATION WITH CORONARY ANGIOGRAM: SHX5451

## 2013-09-21 LAB — GLUCOSE, CAPILLARY
GLUCOSE-CAPILLARY: 106 mg/dL — AB (ref 70–99)
Glucose-Capillary: 89 mg/dL (ref 70–99)

## 2013-09-21 SURGERY — LEFT HEART CATHETERIZATION WITH CORONARY ANGIOGRAM
Anesthesia: LOCAL

## 2013-09-21 MED ORDER — MIDAZOLAM HCL 2 MG/2ML IJ SOLN
INTRAMUSCULAR | Status: AC
  Filled 2013-09-21: qty 2

## 2013-09-21 MED ORDER — SODIUM CHLORIDE 0.9 % IJ SOLN: 3.0000 mL | INTRAMUSCULAR | Status: DC | PRN

## 2013-09-21 MED ORDER — METFORMIN HCL 1000 MG PO TABS: 1000.0000 mg | ORAL_TABLET | Freq: Two times a day (BID) | ORAL | Status: DC

## 2013-09-21 MED ORDER — SODIUM CHLORIDE 0.9 % IV SOLN: 1.0000 mL/kg/h | INTRAVENOUS | Status: DC

## 2013-09-21 MED ORDER — SODIUM CHLORIDE 0.9 % IV BOLUS (SEPSIS)
500.0000 mL | Freq: Once | INTRAVENOUS | Status: AC
  Administered 2013-09-21: 06:00:00 via INTRAVENOUS

## 2013-09-21 MED ORDER — VERAPAMIL HCL 2.5 MG/ML IV SOLN
INTRAVENOUS | Status: AC
  Filled 2013-09-21: qty 2

## 2013-09-21 MED ORDER — HYDROMORPHONE HCL PF 1 MG/ML IJ SOLN
INTRAMUSCULAR | Status: AC
  Filled 2013-09-21: qty 1

## 2013-09-21 MED ORDER — SODIUM CHLORIDE 0.9 % IJ SOLN: 3.0000 mL | Freq: Two times a day (BID) | INTRAMUSCULAR | Status: DC

## 2013-09-21 MED ORDER — NITROGLYCERIN 0.2 MG/ML ON CALL CATH LAB
INTRAVENOUS | Status: AC
  Filled 2013-09-21: qty 1

## 2013-09-21 MED ORDER — SODIUM CHLORIDE 0.9 % IV SOLN: INTRAVENOUS | Status: DC

## 2013-09-21 MED ORDER — LIDOCAINE HCL (PF) 1 % IJ SOLN
INTRAMUSCULAR | Status: AC
  Filled 2013-09-21: qty 30

## 2013-09-21 MED ORDER — SODIUM CHLORIDE 0.9 % IV SOLN: 250.0000 mL | INTRAVENOUS | Status: DC | PRN

## 2013-09-21 MED ORDER — HEPARIN (PORCINE) IN NACL 2-0.9 UNIT/ML-% IJ SOLN
INTRAMUSCULAR | Status: AC
  Filled 2013-09-21: qty 1500

## 2013-09-21 MED ORDER — ASPIRIN 81 MG PO CHEW
81.0000 mg | CHEWABLE_TABLET | ORAL | Status: AC
  Administered 2013-09-21: 81 mg via ORAL
  Filled 2013-09-21: qty 1

## 2013-09-21 NOTE — CV Procedure (Signed)
Procedure performed:  Left heart catheterization including hemodynamic monitoring of the left ventricle, LV gram, selective right and left coronary arteriography. Abdominal aortogram.  Indication patient is a 53 year-old Caucasian female with history of hypertension (), hyperlipidemia (), Tobacco abuse, history of peripheral arterial disease with left carotid to subclavian bypass, right iliac angioplasty and stent implantation in the past,  who presents with atypical chest pain and also claudication. Patient has  had   non invasive testing which was abnormal, revealing anterior ischemia.  Hence is brought to the cardiac catheterization lab to evaluate his coronary anatomy. Abdominal aortogram was performed to evaluate for abdominal atherosclerosis and patency of right iliac artery stent due to symptoms of claudication.  Hemodynamic data:  Left ventricular pressure was 171/17with LVEDP of 21 mm mercury. Aortic pressure was 167/89 with a mean of 121 mm mercury. There was no pressure gradient across the aortic valve.   Left ventricle: Performed in the RAO projection revealed LVEF of 70-80%, hyperdynamic left ventricle with cavity obliteration. There is moderate to marked LVH evident.   Right coronary artery: The vessel is smooth with mild luminal irregularity. It is dominant. No significant stenosis.  Left main coronary artery is large and normal. Mild calcification is evident.  Circumflex coronary artery: A large vessel giving origin to a large obtuse marginal 1. There is mild luminal irregularity.  LAD:  LAD gives origin to a moderate sized D1 and large diagonal 2. LAD has mild proximal calcification and very minimal luminal irregularity. No high-grade stenosis was evident.    Abdominal aortogram: There were 2 renal arteries one on either sides. They were normal.  Abdominal aorta was normal without evidence of abdominal aortic aneurysm. Aortoiliac bifurcation is widely patent. Right common iliac  artery stent is widely patent.  Technique: Under sterile precautions using a 5French right femoral arterial access, a 5 French sheath was introduced into the right femoral artery under fluoroscopy guidance. A 5 [place] multipurpose B2 catheter was advanced into the ascending aorta and then into the left ventricle. Hemodynamics are analysed. LV angiogram   performed in RAO projection. Catheter pulled into the ascending aorta and right coronary artery was cannulated, then left coronary artery was cannulated and angiography was performed in multiple views. The catheter was pulled back into the abdominal aorta and abdominal aortogram was performed.  Catheter exchanged out of the body over J-Wire. NO immediate complications noted. Patient tolerated the procedure well.   Disposition: Will be discharged home today with outpatient follow up. A total of 55 cc of contrast was utilized for diagnostic angiography. I initially did attempt to obtain right radial arterial access, the pulse was very feeble, hence it was abandoned. There was no immediate competitions. The was no significant blood loss.

## 2013-09-21 NOTE — Discharge Instructions (Signed)
Arteriogram °Care After °These instructions give you information on caring for yourself after your procedure. Your doctor may also give you more specific instructions. Call your doctor if you have any problems or questions after your procedure. °HOME CARE °· Stay in bed the rest of the day. °· Keep your leg straight for at least 6 hours. °· Do not lift anything heavier than 10 pounds (about a gallon of milk) for 2 days. °· Do not walk a lot, run, or drive for 2 days. °· Return to normal activities in 2 days or as told by your doctor. °Finding out the results of your test °Ask when your test results will be ready. Make sure you get your test results. °GET HELP RIGHT AWAY IF:  °· You have fever of 102° F (38.9° C) or higher. °· You have more pain in your leg. °· The leg that was cut is: °· Bleeding. °· Puffy (swollen) or red. °· Cold. °· Pale or changes color. °· Weak. °· Tingly or numb. °If you go to the Emergency Room, tell your nurse that you have had an arteriogram. Take this paper with you to show the nurse. °MAKE SURE YOU: °· Understand these instructions. °· Will watch your condition. °· Will get help right away if you are not doing well or get worse. °Document Released: 06/21/2008 Document Revised: 06/17/2011 Document Reviewed: 06/21/2008 °Exit[employer]® Patient Information ©2014 [employer]. ° °

## 2013-09-21 NOTE — H&P (Signed)
  Please see office visit notes for complete details of HPI.  

## 2013-09-21 NOTE — Progress Notes (Signed)
Received pt from cath lab at 850 in no acute distress. No chest pain, no sob. Alert oriented. Hob flat. Sheath removal started at 8057, pressure held until 920, no obvious complication noted. Site covered with 4x4 with tegaderm. No hematoma, pedal pulse present 2+, site soft, nontender. Advised pt to remain flat on bed, to keep rle immobile, and not to raise head. Pt states understanding of instruction.

## 2013-09-21 NOTE — Interval H&P Note (Signed)
History and Physical Interval Note:  09/21/2013 8:15 AM  [name]  has presented today for surgery, with the diagnosis of abnormal stress test, cp, known CAD  The various methods of treatment have been discussed with the patient and family. After consideration of risks, benefits and other options for treatment, the patient has consented to  Procedure(s): LEFT HEART CATHETERIZATION WITH CORONARY ANGIOGRAM (N/A) and possible PCI as a surgical intervention .  The patient's history has been reviewed, patient examined, no change in status, stable for surgery.  I have reviewed the patient's chart and labs.  Questions were answered to the patient's satisfaction.   Patient consents to having students observing: Trenda MootsKevin Spainhover, and Dr. Rodman PickleAmber Hairford.  Pamella PertGANJI,JAGADEESH R

## 2013-09-21 NOTE — Progress Notes (Signed)
Site remains dry, intact, no s/o hematoma, soft. Distal pulses present. Hob at ~20, taking fluids , denies need to void.

## 2014-01-18 ENCOUNTER — Emergency Department (HOSPITAL_COMMUNITY)
Admission: EM | Admit: 2014-01-18 | Discharge: 2014-01-18 | Disposition: A | Discharge status: Home / Self Care | Payer: Medicare Other | Attending: Emergency Medicine | Admitting: Emergency Medicine

## 2014-01-18 ENCOUNTER — Encounter (HOSPITAL_COMMUNITY): Payer: Self-pay | Admitting: Emergency Medicine

## 2014-01-18 DIAGNOSIS — I10 Essential (primary) hypertension: Secondary | ICD-10-CM | POA: Diagnosis present

## 2014-01-18 DIAGNOSIS — Z79899 Other long term (current) drug therapy: Secondary | ICD-10-CM | POA: Insufficient documentation

## 2014-01-18 DIAGNOSIS — Z7982 Long term (current) use of aspirin: Secondary | ICD-10-CM | POA: Diagnosis not present

## 2014-01-18 DIAGNOSIS — I509 Heart failure, unspecified: Secondary | ICD-10-CM | POA: Insufficient documentation

## 2014-01-18 DIAGNOSIS — E119 Type 2 diabetes mellitus without complications: Secondary | ICD-10-CM | POA: Diagnosis not present

## 2014-01-18 DIAGNOSIS — Z720 Tobacco use: Secondary | ICD-10-CM | POA: Diagnosis not present

## 2014-01-18 DIAGNOSIS — Z9889 Other specified postprocedural states: Secondary | ICD-10-CM | POA: Insufficient documentation

## 2014-01-18 DIAGNOSIS — E780 Pure hypercholesterolemia: Secondary | ICD-10-CM | POA: Diagnosis not present

## 2014-01-18 DIAGNOSIS — R011 Cardiac murmur, unspecified: Secondary | ICD-10-CM | POA: Diagnosis not present

## 2014-01-18 DIAGNOSIS — I2510 Atherosclerotic heart disease of native coronary artery without angina pectoris: Secondary | ICD-10-CM | POA: Insufficient documentation

## 2014-01-18 DIAGNOSIS — Z8659 Personal history of other mental and behavioral disorders: Secondary | ICD-10-CM | POA: Diagnosis not present

## 2014-01-18 LAB — URINALYSIS, ROUTINE W REFLEX MICROSCOPIC
Bilirubin Urine: NEGATIVE
Glucose, UA: NEGATIVE mg/dL
KETONES UR: NEGATIVE mg/dL
Leukocytes, UA: NEGATIVE
NITRITE: NEGATIVE
PH: 6 (ref 5.0–8.0)
Protein, ur: NEGATIVE mg/dL
Specific Gravity, Urine: 1.01 (ref 1.005–1.030)
Urobilinogen, UA: 0.2 mg/dL (ref 0.0–1.0)

## 2014-01-18 LAB — CBC WITH DIFFERENTIAL/PLATELET
BASOS ABS: 0 10*3/uL (ref 0.0–0.1)
Basophils Relative: 0 % (ref 0–1)
EOS PCT: 0 % (ref 0–5)
Eosinophils Absolute: 0 10*3/uL (ref 0.0–0.7)
HEMATOCRIT: 37.7 % (ref 36.0–46.0)
HEMOGLOBIN: 12.9 g/dL (ref 12.0–15.0)
Lymphocytes Relative: 31 % (ref 12–46)
Lymphs Abs: 1.8 10*3/uL (ref 0.7–4.0)
MCH: 30.8 pg (ref 26.0–34.0)
MCHC: 34.2 g/dL (ref 30.0–36.0)
MCV: 90 fL (ref 78.0–100.0)
MONO ABS: 0.4 10*3/uL (ref 0.1–1.0)
MONOS PCT: 6 % (ref 3–12)
Neutro Abs: 3.7 10*3/uL (ref 1.7–7.7)
Neutrophils Relative %: 63 % (ref 43–77)
Platelets: 207 10*3/uL (ref 150–400)
RBC: 4.19 MIL/uL (ref 3.87–5.11)
RDW: 13.2 % (ref 11.5–15.5)
WBC: 6 10*3/uL (ref 4.0–10.5)

## 2014-01-18 LAB — BASIC METABOLIC PANEL
ANION GAP: 12 (ref 5–15)
BUN: 7 mg/dL (ref 6–23)
CALCIUM: 9.5 mg/dL (ref 8.4–10.5)
CO2: 28 mEq/L (ref 19–32)
Chloride: 103 mEq/L (ref 96–112)
Creatinine, Ser: 0.9 mg/dL (ref 0.50–1.10)
GFR calc non Af Amer: 71 mL/min — ABNORMAL LOW (ref >90)
GFR, EST AFRICAN AMERICAN: 82 mL/min — AB (ref >90)
Glucose, Bld: 97 mg/dL (ref 70–99)
Potassium: 4.4 mEq/L (ref 3.7–5.3)
Sodium: 143 mEq/L (ref 137–147)

## 2014-01-18 LAB — URINE MICROSCOPIC-ADD ON

## 2014-01-18 MED ORDER — IBUPROFEN 800 MG PO TABS
800.0000 mg | ORAL_TABLET | Freq: Once | ORAL | Status: AC
  Administered 2014-01-18: 800 mg via ORAL
  Filled 2014-01-18: qty 1

## 2014-01-18 NOTE — ED Notes (Signed)
MD at bedside. 

## 2014-01-18 NOTE — ED Provider Notes (Signed)
This chart was scribed for [name] MawKristen N Garren Greenman, DO by Tonye RoyaltyJoshua Chen, ED Scribe. This patient was seen in room APA08/APA08.  TIME SEEN: 1250  CHIEF COMPLAINT: hypertension  HPI: [name] is a 53 y.o. Female with history of HTN and diabetes who presents to the Emergency Department complaining of hypertension. She states she takes Lisinopril 20mg  at night and denies recent changes to medication. She reports that her primary care physician recommended [facility] go [facility] because she has had increased depression recently. No SI or HI or hallucinations. At [facility] her blood pressure was extremely elevated -  224/108.  Blood pressure measured 224/126 with EMS, now 187/102. She denies missing any doses of her antihypertensive medication.  She states her blood pressure usually runs 115-120/60-80, though she states that at her last visit to her PCP, her blood pressure was in the 160s and he increased her Lisinopril dosage. She reports occipital headache with onset today that she describes as an aching throb and rates pain at 3/10. She has had similar headaches in the past. No worst headache of her right, sudden onset headache. She is not on anticoagulation. She denies missing changes, chest pain or shortness of breath, numbness, tingling or focal weakness.  ROS: See HPI Constitutional: no fever  Eyes: no drainage  ENT: no runny nose   Cardiovascular:  no chest pain, hyerpertension Resp: no SOB  GI: no vomiting GU: no dysuria Integumentary: no rash  Allergy: no hives  Musculoskeletal: no leg swelling  Neurological: no slurred speech, headache ROS otherwise negative   PAST MEDICAL HISTORY/PAST SURGICAL HISTORY:  Past Medical History  Diagnosis Date  . Hypertension   . High cholesterol   . Diabetes mellitus   . Coronary artery disease   . Heart murmur   . CHF (congestive heart failure) 2010  . Bipolar 1 disorder 11/26/2011    "not on medicine"  . Anxiety     MEDICATIONS:  Prior  to Admission medications   Medication Sig Start Date End Date Taking? Authorizing Provider  lisinopril (PRINIVIL,ZESTRIL) 20 MG tablet Take 20 mg by mouth daily.   Yes Historical Provider, MD  metFORMIN (GLUCOPHAGE) 500 MG tablet Take 500 mg by mouth at bedtime.   Yes Historical Provider, MD  aspirin EC 81 MG tablet Take 81 mg by mouth daily.    Historical Provider, MD  atorvastatin (LIPITOR) 20 MG tablet Take 20 mg by mouth daily.    Historical Provider, MD  metoprolol succinate (TOPROL-XL) 25 MG 24 hr tablet Take 25 mg by mouth daily.    Historical Provider, MD    ALLERGIES:  Allergies  Allergen Reactions  . Hydrocodone Itching  . Erythromycin Rash    SOCIAL HISTORY:  History  Substance Use Topics  . Smoking status: Current Every Day Smoker -- 0.50 packs/day    Types: Cigarettes  . Smokeless tobacco: Never Used  . Alcohol Use: No    FAMILY HISTORY: No family history on file.  EXAM: BP 180/100  Pulse 84  Temp(Src) 98.6 F (37 C) (Oral)  Resp 18  Ht 4\' 11"  (1.499 m)  Wt 134 lb (60.782 kg)  BMI 27.05 kg/m2  SpO2 100%  LMP 06/26/2011 CONSTITUTIONAL: Alert and oriented and responds appropriately to questions. Well-appearing; well-nourished HEAD: Normocephalic EYES: Conjunctivae clear, PERRL, EOMI ENT: normal nose; no rhinorrhea; moist mucous membranes; pharynx without lesions noted NECK: Supple, no meningismus, no LAD  CARD: RRR; S1 and S2 appreciated; no murmurs, no clicks, no rubs, no gallops  RESP: Normal chest excursion without splinting or tachypnea; breath sounds clear and equal bilaterally; no wheezes, no rhonchi, no rales,  ABD/GI: Normal bowel sounds; non-distended; soft, non-tender, no rebound, no guarding BACK:  The back appears normal and is non-tender to palpation, there is no CVA tenderness EXT: Normal ROM in all joints; non-tender to palpation; no edema; normal capillary refill; no cyanosis    SKIN: Normal color for age and race; warm NEURO: Moves all  extremities equally, sensation to fine touch intact diffusely, cranial nerves 2-12 intact , normal gait PSYCH: The patient's mood and manner are appropriate. Grooming and personal hygiene are appropriate.  MEDICAL DECISION MAKING: Patient here with hypertension. She is having a mild headache which she has had the past. Given her blood pressure was over 220/110, will check basic labs and EKG for signs of end organ damage. Suspect that her hypertension may have been related to [facility] as it has occurred he began to improve without intervention. We will give ibuprofen for her headache closely monitor in the ED. I do not feel she needs a head CT at this time as I am not concerned for infarct or hemorrhage. Patient comfortable with plan.  ED PROGRESS: EKG does show some diffuse T-wave inversions and LVH likely consistent with hypertension but is unchanged compared to prior EKG. Her labs are otherwise unremarkable. She is a normal creatinine and no protein in her urine. She does have trace hemoglobin which have discussed with her that she should followup with her PCP 4. Her blood pressure is now 156/95 and headache is gone. I feel she is safe to be discharged home. We'll have her take her blood pressure regularly at home and she is feeling well and keep a log of this and that she can present this to her primary care physician who may decide if she needs to be started on another antihypertensive medication. Discussed return precautions. She verbalized understanding and is comfortable with plan.    EKG Interpretation  Date/Time:  Tuesday January 18 2014 13:35:15 EDT Ventricular Rate:  82 PR Interval:  162 QRS Duration: 103 QT Interval:  414 QTC Calculation: 483 R Axis:   42 Text Interpretation:  Sinus rhythm Probable left atrial enlargement Left ventricular hypertrophy Abnormal T, consider ischemia, lateral leads No significant change since last tracing Confirmed by Matthan Sledge,  DO,  Kennethia Lynes [phone]([id number]) on 01/18/2014 1:37:51 PM        I personally performed the services described in this documentation, which was scribed in my presence. The recorded information has been reviewed and is accurate.    [name] MawKristen N Ardit Danh, DO 01/18/14 [phone]

## 2014-01-18 NOTE — Discharge Instructions (Signed)
Hypertension °Hypertension, commonly called high blood pressure, is when the force of blood pumping through your arteries is too strong. Your arteries are the blood vessels that carry blood from your heart throughout your body. A blood pressure reading consists of a higher number over a lower number, such as 110/72. The higher number (systolic) is the pressure inside your arteries when your heart pumps. The lower number (diastolic) is the pressure inside your arteries when your heart relaxes. Ideally you want your blood pressure below 120/80. °Hypertension forces your heart to work harder to pump blood. Your arteries may become narrow or stiff. Having hypertension puts you at risk for heart disease, stroke, and other problems.  °RISK FACTORS °Some risk factors for high blood pressure are controllable. Others are not.  °Risk factors you cannot control include:  °· Race. You may be at higher risk if you are African American. °· Age. Risk increases with age. °· Gender. Men are at higher risk than women before age 45 years. After age 65, women are at higher risk than men. °Risk factors you can control include: °· Not getting enough exercise or physical activity. °· Being overweight. °· Getting too much fat, sugar, calories, or salt in your diet. °· Drinking too much alcohol. °SIGNS AND SYMPTOMS °Hypertension does not usually cause signs or symptoms. Extremely high blood pressure (hypertensive crisis) may cause headache, anxiety, shortness of breath, and nosebleed. °DIAGNOSIS  °To check if you have hypertension, your health care provider will measure your blood pressure while you are seated, with your arm held at the level of your heart. It should be measured at least twice using the same arm. Certain conditions can cause a difference in blood pressure between your right and left arms. A blood pressure reading that is higher than normal on one occasion does not mean that you need treatment. If one blood pressure reading  is high, ask your health care provider about having it checked again. °TREATMENT  °Treating high blood pressure includes making lifestyle changes and possibly taking medicine. Living a healthy lifestyle can help lower high blood pressure. You may need to change some of your habits. °Lifestyle changes may include: °· Following the DASH diet. This diet is high in fruits, vegetables, and whole grains. It is low in salt, red meat, and added sugars. °· Getting at least 2½ hours of brisk physical activity every week. °· Losing weight if necessary. °· Not smoking. °· Limiting alcoholic beverages. °· Learning ways to reduce stress. ° If lifestyle changes are not enough to get your blood pressure under control, your health care provider may prescribe medicine. You may need to take more than one. Work closely with your health care provider to understand the risks and benefits. °HOME CARE INSTRUCTIONS °· Have your blood pressure rechecked as directed by your health care provider.   °· Take medicines only as directed by your health care provider. Follow the directions carefully. Blood pressure medicines must be taken as prescribed. The medicine does not work as well when you skip doses. Skipping doses also puts you at risk for problems.   °· Do not smoke.   °· Monitor your blood pressure at home as directed by your health care provider.  °SEEK MEDICAL CARE IF:  °· You think you are having a reaction to medicines taken. °· You have recurrent headaches or feel dizzy. °· You have swelling in your ankles. °· You have trouble with your vision. °SEEK IMMEDIATE MEDICAL CARE IF: °· You develop a severe headache or confusion. °·   You have unusual weakness, numbness, or feel faint. °· You have severe chest or abdominal pain. °· You vomit repeatedly. °· You have trouble breathing. °MAKE SURE YOU:  °· Understand these instructions. °· Will watch your condition. °· Will get help right away if you are not doing well or get worse. °Document  Released: 03/25/2005 Document Revised: 08/09/2013 Document Reviewed: 01/15/2013 °ExitCare® Patient Information ©2015 [employer]. This information is not intended to replace advice given to you by your health care provider. Make sure you discuss any questions you have with your health care provider. ° °How to Take Your Blood Pressure °HOW DO I GET A BLOOD PRESSURE MACHINE? °· You can buy an electronic home blood pressure machine at your local pharmacy. Insurance will sometimes cover the cost if you have a prescription. °· Ask your doctor what type of machine is best for you. There are different machines for your arm and your wrist. °· If you decide to buy a machine to check your blood pressure on your arm, first check the size of your arm so you can buy the right size cuff. To check the size of your arm:   °· Use a measuring tape that shows both inches and centimeters.   °· Wrap the measuring tape around the upper-middle part of your arm. You may need someone to help you measure.   °· Write down your arm measurement in both inches and centimeters.   °· To measure your blood pressure correctly, it is important to have the right size cuff.   °· If your arm is up to 13 inches (up to 34 centimeters), get an adult cuff size. °· If your arm is 13 to 17 inches (35 to 44 centimeters), get a large adult cuff size.   °·  If your arm is 17 to 20 inches (45 to 52 centimeters), get an adult thigh cuff.   °WHAT DO THE NUMBERS MEAN?  °· There are two numbers that make up your blood pressure. For example: 120/80. °¨ The first number (120 in our example) is called the "systolic pressure." It is a measure of the pressure in your blood vessels when your heart is pumping blood. °¨ The second number (80 in our example) is called the "diastolic pressure." It is a measure of the pressure in your blood vessels when your heart is resting between beats. °· Your doctor will tell you what your blood pressure should be. °WHAT SHOULD I DO  BEFORE I CHECK MY BLOOD PRESSURE?  °· Try to rest or relax for at least 30 minutes before you check your blood pressure. °· Do not smoke. °· Do not have any drinks with caffeine, such as: °¨ Soda. °¨ Coffee. °¨ Tea. °· Check your blood pressure in a quiet room. °· Sit down and stretch out your arm on a table. Keep your arm at about the level of your heart. Let your arm relax. °· Make sure that your legs are not crossed. °HOW DO I CHECK MY BLOOD PRESSURE? °· Follow the directions that came with your machine. °· Make sure you remove any tight-fighting clothing from your arm or wrist. Wrap the cuff around your upper arm or wrist. You should be able to fit a finger between the cuff and your arm. If you cannot fit a finger between the cuff and your arm, it is too tight and should be removed and rewrapped. °· Some units require you to manually pump up the arm cuff. °· Automatic units inflate the cuff when you press a   button. °· Cuff deflation is automatic in both models. °· After the cuff is inflated, the unit measures your blood pressure and pulse. The readings are shown on a monitor. Hold still and breathe normally while the cuff is inflated. °· Getting a reading takes less than a minute. °· Some models store readings in a memory. Some provide a printout of readings. If your machine does not store your readings, keep a written record. °· Take readings with you to your next visit with your doctor. °Document Released: 03/07/2008 Document Revised: 08/09/2013 Document Reviewed: 05/20/2013 °ExitCare® Patient Information ©2015 [employer]. This information is not intended to replace advice given to you by your health care provider. Make sure you discuss any questions you have with your health care provider. ° ° °DASH Eating Plan °DASH stands for "Dietary Approaches to Stop Hypertension." The DASH eating plan is a healthy eating plan that has been shown to reduce high blood pressure (hypertension). Additional health benefits  may include reducing the risk of type 2 diabetes mellitus, heart disease, and stroke. The DASH eating plan may also help with weight loss. °WHAT DO I NEED TO KNOW ABOUT THE DASH EATING PLAN? °For the DASH eating plan, you will follow these general guidelines: °· Choose foods with a percent daily value for sodium of less than 5% (as listed on the food label). °· Use salt-free seasonings or herbs instead of table salt or sea salt. °· Check with your health care provider or pharmacist before using salt substitutes. °· Eat lower-sodium products, often labeled as "lower sodium" or "no salt added." °· Eat fresh foods. °· Eat more vegetables, fruits, and low-fat dairy products. °· Choose whole grains. Look for the word "whole" as the first word in the ingredient list. °· Choose fish and skinless chicken or turkey more often than red meat. Limit fish, poultry, and meat to 6 oz (170 g) each day. °· Limit sweets, desserts, sugars, and sugary drinks. °· Choose heart-healthy fats. °· Limit cheese to 1 oz (28 g) per day. °· Eat more home-cooked food and less restaurant, buffet, and fast food. °· Limit fried foods. °· Cook foods using methods other than frying. °· Limit canned vegetables. If you do use them, rinse them well to decrease the sodium. °· When eating at a restaurant, ask that your food be prepared with less salt, or no salt if possible. °WHAT FOODS CAN I EAT? °Seek help from a dietitian for individual calorie needs. °Grains °Whole grain or whole wheat bread. Brown rice. Whole grain or whole wheat pasta. Quinoa, bulgur, and whole grain cereals. Low-sodium cereals. Corn or whole wheat flour tortillas. Whole grain cornbread. Whole grain crackers. Low-sodium crackers. °Vegetables °Fresh or frozen vegetables (raw, steamed, roasted, or grilled). Low-sodium or reduced-sodium tomato and vegetable juices. Low-sodium or reduced-sodium tomato sauce and paste. Low-sodium or reduced-sodium canned vegetables.  °Fruits °All fresh,  canned (in natural juice), or frozen fruits. °Meat and Other Protein Products °Ground beef (85% or leaner), grass-fed beef, or beef trimmed of fat. Skinless chicken or turkey. Ground chicken or turkey. Pork trimmed of fat. All fish and seafood. Eggs. Dried beans, peas, or lentils. Unsalted nuts and seeds. Unsalted canned beans. °Dairy °Low-fat dairy products, such as skim or 1% milk, 2% or reduced-fat cheeses, low-fat ricotta or cottage cheese, or plain low-fat yogurt. Low-sodium or reduced-sodium cheeses. °Fats and Oils °Tub margarines without trans fats. Light or reduced-fat mayonnaise and salad dressings (reduced sodium). Avocado. Safflower, olive, or canola oils. Natural peanut or   almond butter. °Other °Unsalted popcorn and pretzels. °The items listed above may not be a complete list of recommended foods or beverages. Contact your dietitian for more options. °WHAT FOODS ARE NOT RECOMMENDED? °Grains °White bread. White pasta. White rice. Refined cornbread. Bagels and croissants. Crackers that contain trans fat. °Vegetables °Creamed or fried vegetables. Vegetables in a cheese sauce. Regular canned vegetables. Regular canned tomato sauce and paste. Regular tomato and vegetable juices. °Fruits °Dried fruits. Canned fruit in light or heavy syrup. Fruit juice. °Meat and Other Protein Products °Fatty cuts of meat. Ribs, chicken wings, bacon, sausage, bologna, salami, chitterlings, fatback, hot dogs, bratwurst, and packaged luncheon meats. Salted nuts and seeds. Canned beans with salt. °Dairy °Whole or 2% milk, cream, half-and-half, and cream cheese. Whole-fat or sweetened yogurt. Full-fat cheeses or blue cheese. Nondairy creamers and whipped toppings. Processed cheese, cheese spreads, or cheese curds. °Condiments °Onion and garlic salt, seasoned salt, table salt, and sea salt. Canned and packaged gravies. Worcestershire sauce. Tartar sauce. Barbecue sauce. Teriyaki sauce. Soy sauce, including reduced sodium. Steak  sauce. Fish sauce. Oyster sauce. Cocktail sauce. Horseradish. Ketchup and mustard. Meat flavorings and tenderizers. Bouillon cubes. Hot sauce. Tabasco sauce. Marinades. Taco seasonings. Relishes. °Fats and Oils °Butter, stick margarine, lard, shortening, ghee, and bacon fat. Coconut, palm kernel, or palm oils. Regular salad dressings. °Other °Pickles and olives. Salted popcorn and pretzels. °The items listed above may not be a complete list of foods and beverages to avoid. Contact your dietitian for more information. °WHERE CAN I FIND MORE INFORMATION? °National Heart, [employer], and [employer] Institute: www.nhlbi.nih.gov/health/health-topics/topics/dash/ °Document Released: 03/14/2011 Document Revised: 08/09/2013 Document Reviewed: 01/27/2013 °ExitCare® Patient Information ©2015 [employer]. This information is not intended to replace advice given to you by your health care provider. Make sure you discuss any questions you have with your health care provider. ° °

## 2014-01-18 NOTE — ED Notes (Signed)
PT [facility] for new pt visit and they called EMS d/t hypertension at office. PT denies any SOB or chest pain and c/o slight headache this am.

## 2014-03-17 ENCOUNTER — Encounter (HOSPITAL_COMMUNITY): Payer: Self-pay | Admitting: Cardiology

## 2014-12-12 ENCOUNTER — Inpatient Hospital Stay (HOSPITAL_COMMUNITY): Payer: Medicare HMO

## 2014-12-12 ENCOUNTER — Other Ambulatory Visit: Payer: Self-pay

## 2014-12-12 ENCOUNTER — Inpatient Hospital Stay (HOSPITAL_COMMUNITY)
Admission: EM | Admit: 2014-12-12 | Discharge: 2015-01-07 | DRG: 207 | Disposition: E | Payer: Medicare HMO | Attending: Pulmonary Disease | Admitting: Pulmonary Disease

## 2014-12-12 ENCOUNTER — Encounter (HOSPITAL_COMMUNITY): Payer: Self-pay

## 2014-12-12 ENCOUNTER — Emergency Department (HOSPITAL_COMMUNITY): Payer: Medicare HMO

## 2014-12-12 DIAGNOSIS — I959 Hypotension, unspecified: Secondary | ICD-10-CM | POA: Diagnosis not present

## 2014-12-12 DIAGNOSIS — Z881 Allergy status to other antibiotic agents status: Secondary | ICD-10-CM

## 2014-12-12 DIAGNOSIS — Z885 Allergy status to narcotic agent status: Secondary | ICD-10-CM

## 2014-12-12 DIAGNOSIS — K5900 Constipation, unspecified: Secondary | ICD-10-CM | POA: Diagnosis present

## 2014-12-12 DIAGNOSIS — J939 Pneumothorax, unspecified: Secondary | ICD-10-CM | POA: Insufficient documentation

## 2014-12-12 DIAGNOSIS — D649 Anemia, unspecified: Secondary | ICD-10-CM

## 2014-12-12 DIAGNOSIS — J989 Respiratory disorder, unspecified: Secondary | ICD-10-CM

## 2014-12-12 DIAGNOSIS — E876 Hypokalemia: Secondary | ICD-10-CM | POA: Diagnosis present

## 2014-12-12 DIAGNOSIS — J9601 Acute respiratory failure with hypoxia: Secondary | ICD-10-CM | POA: Diagnosis present

## 2014-12-12 DIAGNOSIS — G936 Cerebral edema: Secondary | ICD-10-CM

## 2014-12-12 DIAGNOSIS — J9690 Respiratory failure, unspecified, unspecified whether with hypoxia or hypercapnia: Secondary | ICD-10-CM

## 2014-12-12 DIAGNOSIS — R092 Respiratory arrest: Principal | ICD-10-CM

## 2014-12-12 DIAGNOSIS — J9809 Other diseases of bronchus, not elsewhere classified: Secondary | ICD-10-CM

## 2014-12-12 DIAGNOSIS — I469 Cardiac arrest, cause unspecified: Secondary | ICD-10-CM | POA: Diagnosis present

## 2014-12-12 DIAGNOSIS — G253 Myoclonus: Secondary | ICD-10-CM | POA: Diagnosis present

## 2014-12-12 DIAGNOSIS — J9383 Other pneumothorax: Secondary | ICD-10-CM | POA: Diagnosis present

## 2014-12-12 DIAGNOSIS — R001 Bradycardia, unspecified: Secondary | ICD-10-CM | POA: Diagnosis present

## 2014-12-12 DIAGNOSIS — Z9289 Personal history of other medical treatment: Secondary | ICD-10-CM

## 2014-12-12 DIAGNOSIS — R0600 Dyspnea, unspecified: Secondary | ICD-10-CM | POA: Insufficient documentation

## 2014-12-12 DIAGNOSIS — E1159 Type 2 diabetes mellitus with other circulatory complications: Secondary | ICD-10-CM | POA: Diagnosis present

## 2014-12-12 DIAGNOSIS — E1122 Type 2 diabetes mellitus with diabetic chronic kidney disease: Secondary | ICD-10-CM | POA: Diagnosis present

## 2014-12-12 DIAGNOSIS — F319 Bipolar disorder, unspecified: Secondary | ICD-10-CM | POA: Diagnosis present

## 2014-12-12 DIAGNOSIS — J9811 Atelectasis: Secondary | ICD-10-CM | POA: Diagnosis present

## 2014-12-12 DIAGNOSIS — Z79899 Other long term (current) drug therapy: Secondary | ICD-10-CM | POA: Diagnosis not present

## 2014-12-12 DIAGNOSIS — G931 Anoxic brain damage, not elsewhere classified: Secondary | ICD-10-CM | POA: Diagnosis not present

## 2014-12-12 DIAGNOSIS — Z7982 Long term (current) use of aspirin: Secondary | ICD-10-CM

## 2014-12-12 DIAGNOSIS — I129 Hypertensive chronic kidney disease with stage 1 through stage 4 chronic kidney disease, or unspecified chronic kidney disease: Secondary | ICD-10-CM | POA: Diagnosis present

## 2014-12-12 DIAGNOSIS — D6489 Other specified anemias: Secondary | ICD-10-CM | POA: Diagnosis present

## 2014-12-12 DIAGNOSIS — I422 Other hypertrophic cardiomyopathy: Secondary | ICD-10-CM | POA: Diagnosis present

## 2014-12-12 DIAGNOSIS — Z66 Do not resuscitate: Secondary | ICD-10-CM | POA: Diagnosis present

## 2014-12-12 DIAGNOSIS — E785 Hyperlipidemia, unspecified: Secondary | ICD-10-CM | POA: Diagnosis present

## 2014-12-12 DIAGNOSIS — I2510 Atherosclerotic heart disease of native coronary artery without angina pectoris: Secondary | ICD-10-CM | POA: Diagnosis present

## 2014-12-12 DIAGNOSIS — Z4659 Encounter for fitting and adjustment of other gastrointestinal appliance and device: Secondary | ICD-10-CM

## 2014-12-12 DIAGNOSIS — Z452 Encounter for adjustment and management of vascular access device: Secondary | ICD-10-CM

## 2014-12-12 DIAGNOSIS — I509 Heart failure, unspecified: Secondary | ICD-10-CM | POA: Diagnosis present

## 2014-12-12 DIAGNOSIS — I468 Cardiac arrest due to other underlying condition: Secondary | ICD-10-CM | POA: Diagnosis present

## 2014-12-12 DIAGNOSIS — E1151 Type 2 diabetes mellitus with diabetic peripheral angiopathy without gangrene: Secondary | ICD-10-CM | POA: Diagnosis present

## 2014-12-12 DIAGNOSIS — J449 Chronic obstructive pulmonary disease, unspecified: Secondary | ICD-10-CM | POA: Diagnosis present

## 2014-12-12 DIAGNOSIS — Z978 Presence of other specified devices: Secondary | ICD-10-CM

## 2014-12-12 DIAGNOSIS — F17210 Nicotine dependence, cigarettes, uncomplicated: Secondary | ICD-10-CM | POA: Diagnosis present

## 2014-12-12 DIAGNOSIS — Z515 Encounter for palliative care: Secondary | ICD-10-CM

## 2014-12-12 DIAGNOSIS — D696 Thrombocytopenia, unspecified: Secondary | ICD-10-CM | POA: Diagnosis present

## 2014-12-12 DIAGNOSIS — Z8249 Family history of ischemic heart disease and other diseases of the circulatory system: Secondary | ICD-10-CM | POA: Diagnosis not present

## 2014-12-12 DIAGNOSIS — N183 Chronic kidney disease, stage 3 (moderate): Secondary | ICD-10-CM | POA: Diagnosis present

## 2014-12-12 DIAGNOSIS — J690 Pneumonitis due to inhalation of food and vomit: Secondary | ICD-10-CM | POA: Diagnosis present

## 2014-12-12 DIAGNOSIS — T85698A Other mechanical complication of other specified internal prosthetic devices, implants and grafts, initial encounter: Secondary | ICD-10-CM | POA: Diagnosis not present

## 2014-12-12 DIAGNOSIS — T17500A Unspecified foreign body in bronchus causing asphyxiation, initial encounter: Secondary | ICD-10-CM

## 2014-12-12 DIAGNOSIS — R0902 Hypoxemia: Secondary | ICD-10-CM

## 2014-12-12 DIAGNOSIS — Z01818 Encounter for other preprocedural examination: Secondary | ICD-10-CM

## 2014-12-12 LAB — GLUCOSE, CAPILLARY
GLUCOSE-CAPILLARY: 136 mg/dL — AB (ref 65–99)
GLUCOSE-CAPILLARY: 121 mg/dL — AB (ref 65–99)
Glucose-Capillary: 192 mg/dL — ABNORMAL HIGH (ref 65–99)
Glucose-Capillary: 131 mg/dL — ABNORMAL HIGH (ref 65–99)
Glucose-Capillary: 74 mg/dL (ref 65–99)

## 2014-12-12 LAB — BASIC METABOLIC PANEL
Anion gap: 11 (ref 5–15)
Anion gap: 13 (ref 5–15)
Anion gap: 9 (ref 5–15)
BUN: 13 mg/dL (ref 6–20)
BUN: 13 mg/dL (ref 6–20)
BUN: 15 mg/dL (ref 6–20)
CHLORIDE: 104 mmol/L (ref 101–111)
CHLORIDE: 107 mmol/L (ref 101–111)
CHLORIDE: 108 mmol/L (ref 101–111)
CO2: 20 mmol/L — AB (ref 22–32)
CO2: 23 mmol/L (ref 22–32)
CO2: 24 mmol/L (ref 22–32)
Calcium: 8.3 mg/dL — ABNORMAL LOW (ref 8.9–10.3)
Calcium: 8.6 mg/dL — ABNORMAL LOW (ref 8.9–10.3)
Calcium: 8.4 mg/dL — ABNORMAL LOW (ref 8.9–10.3)
Creatinine, Ser: 1.34 mg/dL — ABNORMAL HIGH (ref 0.44–1.00)
Creatinine, Ser: 1.3 mg/dL — ABNORMAL HIGH (ref 0.44–1.00)
Creatinine, Ser: 0.97 mg/dL (ref 0.44–1.00)
GFR calc Af Amer: 51 mL/min — ABNORMAL LOW (ref >60)
GFR calc Af Amer: 53 mL/min — ABNORMAL LOW (ref >60)
GFR calc Af Amer: >60 mL/min (ref >60)
GFR calc non Af Amer: 44 mL/min — ABNORMAL LOW (ref >60)
GFR calc non Af Amer: >60 mL/min (ref >60)
GFR, EST NON AFRICAN AMERICAN: 45 mL/min — AB (ref >60)
GLUCOSE: 175 mg/dL — AB (ref 65–99)
GLUCOSE: 83 mg/dL (ref 65–99)
Glucose, Bld: 347 mg/dL — ABNORMAL HIGH (ref 65–99)
POTASSIUM: 3.9 mmol/L (ref 3.5–5.1)
POTASSIUM: 4.2 mmol/L (ref 3.5–5.1)
POTASSIUM: 4.6 mmol/L (ref 3.5–5.1)
SODIUM: 138 mmol/L (ref 135–145)
Sodium: 140 mmol/L (ref 135–145)
Sodium: 141 mmol/L (ref 135–145)

## 2014-12-12 LAB — TROPONIN I
TROPONIN I: 0.17 ng/mL — AB (ref <0.031)
TROPONIN I: 0.18 ng/mL — AB (ref <0.031)
Troponin I: 0.18 ng/mL — ABNORMAL HIGH (ref <0.031)
Troponin I: 0.17 ng/mL — ABNORMAL HIGH (ref <0.031)

## 2014-12-12 LAB — POCT I-STAT 3, ART BLOOD GAS (G3+)
Acid-base deficit: 2 mmol/L (ref 0.0–2.0)
BICARBONATE: 25.2 meq/L — AB (ref 20.0–24.0)
O2 Saturation: 100 %
PCO2 ART: 49.5 mmHg — AB (ref 35.0–45.0)
Patient temperature: 36
TCO2: 27 mmol/L (ref 0–100)
pH, Arterial: 7.31 — ABNORMAL LOW (ref 7.350–7.450)
pO2, Arterial: 363 mmHg — ABNORMAL HIGH (ref 80.0–100.0)

## 2014-12-12 LAB — URINALYSIS, ROUTINE W REFLEX MICROSCOPIC
BILIRUBIN URINE: NEGATIVE
GLUCOSE, UA: NEGATIVE mg/dL
Hgb urine dipstick: NEGATIVE
KETONES UR: NEGATIVE mg/dL
Leukocytes, UA: NEGATIVE
Nitrite: NEGATIVE
PH: 5 (ref 5.0–8.0)
Protein, ur: NEGATIVE mg/dL
Specific Gravity, Urine: 1.014 (ref 1.005–1.030)
Urobilinogen, UA: 0.2 mg/dL (ref 0.0–1.0)

## 2014-12-12 LAB — PROTIME-INR
INR: 1.03 (ref 0.00–1.49)
INR: 1.08 (ref 0.00–1.49)
PROTHROMBIN TIME: 13.7 s (ref 11.6–15.2)
Prothrombin Time: 14.2 seconds (ref 11.6–15.2)

## 2014-12-12 LAB — I-STAT TROPONIN, ED: Troponin i, poc: 0.11 ng/mL (ref 0.00–0.08)

## 2014-12-12 LAB — CBC
HCT: 42.5 % (ref 36.0–46.0)
HEMATOCRIT: 44 % (ref 36.0–46.0)
HEMOGLOBIN: 15 g/dL (ref 12.0–15.0)
Hemoglobin: 14.4 g/dL (ref 12.0–15.0)
MCH: 31.9 pg (ref 26.0–34.0)
MCH: 31.6 pg (ref 26.0–34.0)
MCHC: 33.9 g/dL (ref 30.0–36.0)
MCHC: 34.1 g/dL (ref 30.0–36.0)
MCV: 94 fL (ref 78.0–100.0)
MCV: 92.8 fL (ref 78.0–100.0)
Platelets: 258 10*3/uL (ref 150–400)
Platelets: 160 10*3/uL (ref 150–400)
RBC: 4.52 MIL/uL (ref 3.87–5.11)
RBC: 4.74 MIL/uL (ref 3.87–5.11)
RDW: 12.8 % (ref 11.5–15.5)
RDW: 13 % (ref 11.5–15.5)
WBC: 14.9 10*3/uL — ABNORMAL HIGH (ref 4.0–10.5)
WBC: 13.6 10*3/uL — AB (ref 4.0–10.5)

## 2014-12-12 LAB — APTT
APTT: 28 s (ref 24–37)
aPTT: 26 seconds (ref 24–37)

## 2014-12-12 LAB — LACTIC ACID, PLASMA: LACTIC ACID, VENOUS: 5 mmol/L — AB (ref 0.5–2.0)

## 2014-12-12 LAB — BRAIN NATRIURETIC PEPTIDE: B Natriuretic Peptide: 1245 pg/mL — ABNORMAL HIGH (ref 0.0–100.0)

## 2014-12-12 LAB — I-STAT CG4 LACTIC ACID, ED: LACTIC ACID, VENOUS: 5.05 mmol/L — AB (ref 0.5–2.0)

## 2014-12-12 LAB — MRSA PCR SCREENING: MRSA by PCR: NEGATIVE

## 2014-12-12 MED ORDER — FENTANYL BOLUS VIA INFUSION
50.0000 ug | INTRAVENOUS | Status: DC | PRN
  Administered 2014-12-12 – 2014-12-16 (×5): 50 ug via INTRAVENOUS
  Filled 2014-12-12: qty 50

## 2014-12-12 MED ORDER — MIDAZOLAM HCL 2 MG/2ML IJ SOLN
2.0000 mg | Freq: Once | INTRAMUSCULAR | Status: AC
  Administered 2014-12-12: 2 mg via INTRAVENOUS
  Filled 2014-12-12: qty 2

## 2014-12-12 MED ORDER — NOREPINEPHRINE BITARTRATE 1 MG/ML IV SOLN
0.0000 ug/min | INTRAVENOUS | Status: DC
  Administered 2014-12-14: 10 ug/min via INTRAVENOUS
  Filled 2014-12-12 (×2): qty 4

## 2014-12-12 MED ORDER — ROCURONIUM BROMIDE 50 MG/5ML IV SOLN
INTRAVENOUS | Status: AC
  Filled 2014-12-12: qty 2

## 2014-12-12 MED ORDER — VITAL HIGH PROTEIN PO LIQD
1000.0000 mL | ORAL | Status: DC
  Administered 2014-12-12 (×2)
  Administered 2014-12-13 – 2014-12-16 (×6): 1000 mL
  Administered 2014-12-17 (×5)
  Administered 2014-12-17: 1000 mL
  Administered 2014-12-18 (×4)
  Filled 2014-12-12 (×9): qty 1000

## 2014-12-12 MED ORDER — FAMOTIDINE IN NACL 20-0.9 MG/50ML-% IV SOLN
20.0000 mg | Freq: Two times a day (BID) | INTRAVENOUS | Status: DC
  Administered 2014-12-12 – 2014-12-16 (×10): 20 mg via INTRAVENOUS
  Filled 2014-12-12 (×10): qty 50

## 2014-12-12 MED ORDER — FENTANYL CITRATE (PF) 100 MCG/2ML IJ SOLN
50.0000 ug | Freq: Once | INTRAMUSCULAR | Status: AC
  Administered 2014-12-12: 50 ug via INTRAVENOUS

## 2014-12-12 MED ORDER — PROPOFOL 10 MG/ML IV BOLUS
0.5000 mg/kg | Freq: Once | INTRAVENOUS | Status: AC
  Administered 2014-12-12: 36.3 mg via INTRAVENOUS

## 2014-12-12 MED ORDER — HEPARIN SODIUM (PORCINE) 5000 UNIT/ML IJ SOLN
5000.0000 [IU] | Freq: Three times a day (TID) | INTRAMUSCULAR | Status: DC
  Administered 2014-12-12 – 2014-12-16 (×13): 5000 [IU] via SUBCUTANEOUS
  Filled 2014-12-12 (×13): qty 1

## 2014-12-12 MED ORDER — ETOMIDATE 2 MG/ML IV SOLN
INTRAVENOUS | Status: AC
  Filled 2014-12-12: qty 20

## 2014-12-12 MED ORDER — PROPOFOL 1000 MG/100ML IV EMUL
0.0000 ug/kg/min | INTRAVENOUS | Status: DC
  Administered 2014-12-12 – 2014-12-13 (×2): 40 ug/kg/min via INTRAVENOUS
  Administered 2014-12-13: 50 ug/kg/min via INTRAVENOUS
  Administered 2014-12-13 – 2014-12-14 (×2): 45 ug/kg/min via INTRAVENOUS
  Administered 2014-12-14: 50 ug/kg/min via INTRAVENOUS
  Administered 2014-12-14 (×2): 45 ug/kg/min via INTRAVENOUS
  Administered 2014-12-15: 20 ug/kg/min via INTRAVENOUS
  Administered 2014-12-17: 15 ug/kg/min via INTRAVENOUS
  Administered 2014-12-17: 10 ug/kg/min via INTRAVENOUS
  Administered 2014-12-18: 25 ug/kg/min via INTRAVENOUS
  Administered 2014-12-18: 30 ug/kg/min via INTRAVENOUS
  Administered 2014-12-18: 20 ug/kg/min via INTRAVENOUS
  Administered 2014-12-19: 30 ug/kg/min via INTRAVENOUS
  Filled 2014-12-12 (×17): qty 100

## 2014-12-12 MED ORDER — FENTANYL CITRATE (PF) 100 MCG/2ML IJ SOLN
50.0000 ug | Freq: Once | INTRAMUSCULAR | Status: AC
  Administered 2014-12-12: 50 ug via INTRAVENOUS
  Filled 2014-12-12: qty 2

## 2014-12-12 MED ORDER — LABETALOL HCL 5 MG/ML IV SOLN: 10.0000 mg | INTRAVENOUS | Status: DC | PRN

## 2014-12-12 MED ORDER — PROPOFOL 1000 MG/100ML IV EMUL
INTRAVENOUS | Status: AC
  Filled 2014-12-12: qty 100

## 2014-12-12 MED ORDER — SODIUM CHLORIDE 0.9 % IV SOLN
3.0000 g | Freq: Four times a day (QID) | INTRAVENOUS | Status: DC
  Administered 2014-12-12 – 2014-12-19 (×28): 3 g via INTRAVENOUS
  Filled 2014-12-12 (×30): qty 3

## 2014-12-12 MED ORDER — SUCCINYLCHOLINE CHLORIDE 20 MG/ML IJ SOLN
INTRAMUSCULAR | Status: AC
  Filled 2014-12-12: qty 1

## 2014-12-12 MED ORDER — SODIUM CHLORIDE 0.9 % IV SOLN
25.0000 ug/h | INTRAVENOUS | Status: DC
  Administered 2014-12-12 – 2014-12-14 (×2): 50 ug/h via INTRAVENOUS
  Administered 2014-12-14 – 2014-12-16 (×2): 25 ug/h via INTRAVENOUS
  Administered 2014-12-17 – 2014-12-18 (×2): 100 ug/h via INTRAVENOUS
  Filled 2014-12-12 (×7): qty 50

## 2014-12-12 MED ORDER — LIDOCAINE HCL (CARDIAC) 20 MG/ML IV SOLN
INTRAVENOUS | Status: AC
  Filled 2014-12-12: qty 5

## 2014-12-12 MED ORDER — ROCURONIUM BROMIDE 50 MG/5ML IV SOLN
50.0000 mg | Freq: Once | INTRAVENOUS | Status: AC
  Administered 2014-12-12: 50 mg via INTRAVENOUS
  Filled 2014-12-12: qty 5

## 2014-12-12 MED ORDER — LORAZEPAM 2 MG/ML IJ SOLN
INTRAMUSCULAR | Status: AC
  Filled 2014-12-12: qty 2

## 2014-12-12 MED ORDER — LORAZEPAM 2 MG/ML IJ SOLN
4.0000 mg | Freq: Once | INTRAMUSCULAR | Status: AC
  Administered 2014-12-12: 4 mg via INTRAVENOUS

## 2014-12-12 MED ORDER — ETOMIDATE 2 MG/ML IV SOLN
INTRAVENOUS | Status: DC
Start: 2014-12-12 — End: 2014-12-12
  Filled 2014-12-12: qty 20

## 2014-12-12 MED ORDER — INSULIN ASPART 100 UNIT/ML ~~LOC~~ SOLN
2.0000 [IU] | SUBCUTANEOUS | Status: DC
  Administered 2014-12-12 – 2014-12-14 (×10): 2 [IU] via SUBCUTANEOUS
  Administered 2014-12-15 (×4): 4 [IU] via SUBCUTANEOUS
  Administered 2014-12-15: 2 [IU] via SUBCUTANEOUS
  Administered 2014-12-16: 4 [IU] via SUBCUTANEOUS
  Administered 2014-12-16 (×2): 2 [IU] via SUBCUTANEOUS
  Administered 2014-12-16 (×2): 4 [IU] via SUBCUTANEOUS
  Administered 2014-12-16 – 2014-12-17 (×2): 2 [IU] via SUBCUTANEOUS
  Administered 2014-12-17: 4 [IU] via SUBCUTANEOUS
  Administered 2014-12-17 – 2014-12-18 (×4): 2 [IU] via SUBCUTANEOUS

## 2014-12-12 MED ORDER — FENTANYL CITRATE (PF) 100 MCG/2ML IJ SOLN
INTRAMUSCULAR | Status: AC
  Filled 2014-12-12: qty 2

## 2014-12-12 MED ORDER — ASPIRIN 300 MG RE SUPP
300.0000 mg | RECTAL | Status: AC
  Administered 2014-12-12: 300 mg via RECTAL
  Filled 2014-12-12: qty 1

## 2014-12-12 MED ORDER — CHLORHEXIDINE GLUCONATE 0.12% ORAL RINSE (MEDLINE KIT)
15.0000 mL | Freq: Two times a day (BID) | OROMUCOSAL | Status: DC
  Administered 2014-12-12 – 2014-12-19 (×15): 15 mL via OROMUCOSAL

## 2014-12-12 MED ORDER — PROPOFOL 1000 MG/100ML IV EMUL
0.0000 ug/kg/min | INTRAVENOUS | Status: DC
  Administered 2014-12-12: 5 ug/kg/min via INTRAVENOUS
  Administered 2014-12-12: 50 ug/kg/min via INTRAVENOUS
  Filled 2014-12-12: qty 100

## 2014-12-12 MED ORDER — SODIUM CHLORIDE 0.9 % IV SOLN
2000.0000 mL | Freq: Once | INTRAVENOUS | Status: AC
  Administered 2014-12-12: 1000 mL via INTRAVENOUS

## 2014-12-12 MED ORDER — CHLORHEXIDINE GLUCONATE 0.12 % MT SOLN
15.0000 mL | Freq: Once | OROMUCOSAL | Status: AC
  Administered 2014-12-12: 15 mL via OROMUCOSAL

## 2014-12-12 MED ORDER — FUROSEMIDE 10 MG/ML IJ SOLN
80.0000 mg | Freq: Once | INTRAMUSCULAR | Status: AC
  Administered 2014-12-12: 80 mg via INTRAVENOUS
  Filled 2014-12-12: qty 8

## 2014-12-12 MED ORDER — ANTISEPTIC ORAL RINSE SOLUTION (CORINZ)
7.0000 mL | OROMUCOSAL | Status: DC
  Administered 2014-12-12 – 2014-12-19 (×72): 7 mL via OROMUCOSAL

## 2014-12-12 MED ORDER — SODIUM CHLORIDE 0.9 % IV SOLN
INTRAVENOUS | Status: DC
  Administered 2014-12-14 – 2014-12-18 (×4): via INTRAVENOUS

## 2014-12-12 NOTE — ED Notes (Signed)
Right TIb/Fib I/O in. [facility] Airway in .

## 2014-12-12 NOTE — H&P (Signed)
PULMONARY / CRITICAL CARE MEDICINE   Name: [name] MRN: [id number] DOB: [birth year]    ADMISSION DATE:  04-Jan-2015 CONSULTATION DATE:  2015/01/04   REFERRING MD :  [facility]  CHIEF COMPLAINT:  Cardiac arrest  INITIAL PRESENTATION: Transferred to [facility] via EMS after cardiac arrest  STUDIES:    SIGNIFICANT EVENTS: Intubated in ED January 04, 2015 Cooled to [facility] 04-Jan-2015   HISTORY OF PRESENT ILLNESS:  [name] is a 54 y/o woman who  has a past medical history of Hypertension; High cholesterol; Diabetes mellitus; Coronary artery disease; Heart murmur; CHF (congestive heart failure) (2010); Bipolar 1 disorder (11/26/2011); and Anxiety.  Per report she called EMS with the complain of shortness of breath.  When they arrived they found her pulseless.  She underwent about 6 minutes of CPR prior to ROSC.  She was intubated in the field with a [facility] airway which was changed to an ET tube at [facility].  Cooling was initiated by EMS en route to the ED.  She did have an episode of emesis with the [facility] airway in place and is thought to have aspirated.   SUBJECTIVE: Sporadic hypotension noted during the morning requiring levophed for BP support but then d/ced.  Remains unresponsive and not following any commands.  VITAL SIGNS: Temp:  [94.3 F (34.6 C)-98.4 F (36.9 C)] 97.3 F (36.3 C) (09/05 1000) Pulse Rate:  [68-110] 68 (09/05 0900) Resp:  [13-32] 14 (09/05 0900) BP: (82-212)/(51-142) 82/51 mmHg (09/05 0900) SpO2:  [95 %-100 %] 100 % (09/05 0900) Arterial Line BP: (98-156)/(46-71) 98/46 mmHg (09/05 0900) FiO2 (%):  [40 %-100 %] 40 % (09/05 0809) Weight:  [61 kg (134 lb 7.7 oz)-72.576 kg (160 lb)] 61 kg (134 lb 7.7 oz) (09/05 0254)   HEMODYNAMICS:     VENTILATOR SETTINGS: Vent Mode:  [-] PRVC FiO2 (%):  [40 %-100 %] 40 % Set Rate:  [14 bmp] 14 bmp Vt Set:  [420 mL] 420 mL PEEP:  [5 cmH20] 5 cmH20 Plateau Pressure:  [21 cmH20-22 cmH20] 21 cmH20  INTAKE / OUTPUT:  Intake/Output Summary (Last 24  hours) at 01/04/15 1114 Last data filed at 2015-01-04 1030  Gross per 24 hour  Intake 121.25 ml  Output   1520 ml  Net -1398.75 ml   PHYSICAL EXAMINATION: Physical Exam  Constitutional: She appears well-developed and well-nourished.  HENT:  Head: Normocephalic and atraumatic.  ETT in place  Eyes: Conjunctivae are normal. Pupils are equal, round, and reactive to light. No scleral icterus.  Neck: Neck supple. No JVD present. No thyromegaly present.  Cardiovascular: Normal rate, regular rhythm, normal heart sounds and intact distal pulses.  Exam reveals no gallop and no friction rub.   No murmur heard. Pulmonary/Chest:  Diminished breath sounds bilaterally  Abdominal: Soft. Bowel sounds are normal. She exhibits no distension and no mass.  Musculoskeletal: She exhibits no edema.  Lymphadenopathy:    She has no cervical adenopathy.  Neurological:  Sedated.  Unresponsive.  Skin: Skin is dry. No rash noted.   LABS:  CBC  Recent Labs Lab 01/04/2015 0045 01-04-15 0411  WBC 14.9* 13.6*  HGB 14.4 15.0  HCT 42.5 44.0  PLT 258 160   Coag's  Recent Labs Lab 01-04-2015 0045 01/04/2015 0411  APTT 28 26  INR 1.03 1.08   BMET  Recent Labs Lab 01/04/15 0045 01/04/2015 0411  NA 138 140  K 4.2 4.6  CL 107 104  CO2 20* 23  BUN 13 13  CREATININE 1.34* 1.30*  GLUCOSE 347* 175*   Electrolytes  Recent Labs Lab 12/11/2014 0045 12/25/2014 0411  CALCIUM 8.3* 8.6*   Sepsis Markers  Recent Labs Lab 12/13/2014 0044 12/22/2014 0110  LATICACIDVEN 5.0* 5.05*   ABG  Recent Labs Lab 12/09/2014 0115 12/19/2014 0446  PHART 7.195* 7.310*  PCO2ART 54.8* 49.5*  PO2ART 172.0* 363.0*   Liver Enzymes No results for input(s): AST, ALT, ALKPHOS, BILITOT, ALBUMIN in the last 168 hours. Cardiac Enzymes  Recent Labs Lab 12/24/2014 0411 12/14/2014 0845  TROPONINI 0.17* 0.18*   Glucose  Recent Labs Lab 01/03/2015 0307 12/23/2014 0525 12/16/2014 0813  GLUCAP 192* 131* 136*    Imaging Dg  Chest Port 1 View  12/28/2014   CLINICAL DATA:  Intubation and OG tube.  Shortness of breath.  EXAM: PORTABLE CHEST - 1 VIEW  COMPARISON:  12/31/2014  FINDINGS: Endotracheal tube tip measures 2.1 cm above the carina. Enteric tube tip is off the field of view but below the left hemidiaphragm. Multiple EKG wires. Mild cardiac enlargement with mild pulmonary vascular congestion and probable perihilar edema. No blunting of costophrenic angles. No pneumothorax. Mediastinal contours appear intact.  IMPRESSION: Appliances appear in satisfactory position. Mild cardiac enlargement with pulmonary vascular congestion and edema.   Electronically Signed   By: Burman Nieves M.D.   On: 12/26/2014 03:49   Dg Chest Port 1 View  12/11/2014   CLINICAL DATA:  Difficulty breathing. Episode of unresponsiveness. Apnea and asystole. Shortness of breath.  EXAM: PORTABLE CHEST - 1 VIEW  COMPARISON:  05/26/2013  FINDINGS: Backboard and equipment artifact. Endotracheal tube placed with tip measuring 3.4 cm above the carina. Enteric tube is present. Tip is off the field of view but is below the left hemidiaphragm. There is focal infiltration or atelectasis in the right lung base. Left lung appears grossly clear. Mild cardiac enlargement and pulmonary vascular congestion. No pneumothorax. No blunting of costophrenic angles.  IMPRESSION: Appliances appear to be in satisfactory position. Cardiac enlargement with mild vascular congestion. Focal atelectasis or consolidation in the right lung base.   Electronically Signed   By: Burman Nieves M.D.   On: 12/08/2014 02:23   ASSESSMENT / PLAN:  PULMONARY OETT 7.5 ETT 9/5>>> A:Intubated during arrest, likely respiratory arrest resulting in cardiac arrest. P:   Lung protective ventilation Wean as tolerated SBT when meets criteria Likely aspiration event - monitor for development of aspiration pneumonia will pan culture and start unasyn.  CARDIOVASCULAR A: Asystolic arrest P:   Normothermia 36C protocol Cardiology consult  RENAL A:  At risk for electrolyte abnormalities P:   Monitor electrolytes regularly Strict Is/Os Replace electrolytes as indicated.  GASTROINTESTINAL A:  Stress ulcer risk P:   Stress ulcer prophylaxis Start TF per nutrition.  NEUROLOGIC A:  Unknown down time - posturing vs. Seizure activity on arrival P:   RASS goal: 0 Abnormal movements resolved with propofol/ativan EEG per protocol Sedation via propofol and fentanyl  FAMILY  - Updates: No family bedside.  - Inter-disciplinary family meet or Palliative Care meeting due by:  day 7  The patient is critically ill with multiple organ systems failure and requires high complexity decision making for assessment and support, frequent evaluation and titration of therapies, application of advanced monitoring technologies and extensive interpretation of multiple databases.   Critical Care Time devoted to patient care services described in this note is  35  Minutes. This time reflects time of care of this signee Dr Koren Bound. This critical care time does not reflect procedure time,  or teaching time or supervisory time of PA/NP/Med student/Med Resident etc but could involve care discussion time.  Alyson Reedy, M.D. [facility] Pulmonary/Critical Care Medicine. Pager: [phone]. After hours pager: [phone].  12/19/2014, 11:14 AM

## 2014-12-12 NOTE — Progress Notes (Addendum)
Initial Nutrition Assessment   INTERVENTION:   Initiate the Adult TF Protocol. Vital High Protein @ 50 ml/hr (1200 kcal, 105 grams protein, and 1003 ml H2O)  TF regimen and propofol at current rate providing 1345 total kcal/day (100 % of kcal needs)    NUTRITION DIAGNOSIS:   Inadequate oral intake related to inability to eat as evidenced by NPO status.   GOAL:   Patient will meet greater than or equal to 90% of their needs   MONITOR:   Vent status, Labs, Weight trends, I & O's  REASON FOR ASSESSMENT:   Consult Enteral/tube feeding initiation and management  ASSESSMENT:   Pt has a past medical history of Hypertension; High cholesterol; Diabetes mellitus; Coronary artery disease; Heart murmur; CHF (congestive heart failure) (2010); Bipolar 1 disorder (11/26/2011); and Anxiety. Per report she called EMS with the complain of shortness of breath. When they arrived they found her pulseless. She did have an episode of emesis with the [facility] airway in place and is thought to have aspirated.   Patient is currently intubated on ventilator support MV: 8.4 L/min Temp (24hrs), Avg:96.3 F (35.7 C), Min:94.3 F (34.6 C), Max:98.4 F (36.9 C)  Propofol: 5.5 ml/hr provides 145 kcal/day from lipid Pt is on normotherapy.  Spoke with RN, awaiting for son who is apparently the Management consultant.  Nutrition-Focused physical exam completed. Findings are none fat depletion, none muscle depletion, and none edema.  Spoke with MD, OK to start TF   Diet Order:   NPO   Skin:  Reviewed, no issues  Last BM:  unknown  Height:   Ht Readings from Last 1 Encounters:  12-30-2014  (1.575 m)    Weight:   Wt Readings from Last 1 Encounters:  2014/12/30 134 lb 7.7 oz (61 kg)    Ideal Body Weight:  50 kg  BMI:  Body mass index is 24.59 kg/(m^2).  Estimated Nutritional Needs:   Kcal:  1343  Protein:  75-90 grams  Fluid:  >/= 1.5 L/day  EDUCATION NEEDS:   No education needs  identified at this time  Kendell Bane RD, LDN, CNSC [phone] Pager [phone] After Hours Pager

## 2014-12-12 NOTE — Progress Notes (Signed)
ANTIBIOTIC CONSULT NOTE - INITIAL  Pharmacy Consult for Unasyn Indication: Aspiration PNA  Allergies  Allergen Reactions  . Hydrocodone Itching  . Erythromycin Rash    Patient Measurements: Height:  (157.5 cm) Weight: 134 lb 7.7 oz (61 kg) IBW/kg (Calculated) : 50.1   Vital Signs: Temp: 97.2 F (36.2 C) (09/05 1118) Temp Source: Core (Comment) (09/05 1118) BP: 76/47 mmHg (09/05 1118) Pulse Rate: 72 (09/05 1118) Intake/Output from previous day: 09/04 0701 - 09/05 0700 In: -  Out: 700 [Urine:700] Intake/Output from this shift: Total I/O In: 136.5 [I.V.:133.5; Other:3] Out: 820 [Urine:820]  Labs:  Recent Labs  12/23/2014 0045 12/19/2014 0411  WBC 14.9* 13.6*  HGB 14.4 15.0  PLT 258 160  CREATININE 1.34* 1.30*   Estimated Creatinine Clearance: 42.1 mL/min (by C-G formula based on Cr of 1.3). No results for input(s): VANCOTROUGH, VANCOPEAK, VANCORANDOM, GENTTROUGH, GENTPEAK, GENTRANDOM, TOBRATROUGH, TOBRAPEAK, TOBRARND, AMIKACINPEAK, AMIKACINTROU, AMIKACIN in the last 72 hours.   Microbiology: Recent Results (from the past 720 hour(s))  MRSA PCR Screening     Status: None   Collection Time: 12/21/2014  2:57 AM  Result Value Ref Range Status   MRSA by PCR NEGATIVE NEGATIVE Final    Comment:        The GeneXpert MRSA Assay (FDA approved for NASAL specimens only), is one component of a comprehensive MRSA colonization surveillance program. It is not intended to diagnose MRSA infection nor to guide or monitor treatment for MRSA infections.     Medical History: Past Medical History  Diagnosis Date  . Hypertension   . High cholesterol   . Diabetes mellitus   . Coronary artery disease   . Heart murmur   . CHF (congestive heart failure) 2010  . Bipolar 1 disorder 11/26/2011    "not on medicine"  . Anxiety     Medications:  Scheduled:  . antiseptic oral rinse  7 mL Mouth Rinse 10 times per day  . chlorhexidine gluconate  15 mL Mouth Rinse BID  .  famotidine (PEPCID) IV  20 mg Intravenous Q12H  . feeding supplement (VITAL HIGH PROTEIN)  1,000 mL Per Tube Q24H  . heparin  5,000 Units Subcutaneous 3 times per day  . insulin aspart  2-6 Units Subcutaneous 6 times per day    Assessment: 54 yo female with VDRF s/p cardiac arrest on normothermia protocol.  With concern for aspiration PNA, pharmacy has been consulted to dose Unasyn.   9/5 unasyn>>  9/5 urine 9/5 blood x2 9/5 resp  Plan:  -Begin Unasyn 3gm IV q6h -Will follow renal function, cultures and clinical progress  Harland German, Pharm D 12/23/2014 11:49 AM

## 2014-12-12 NOTE — Consult Note (Signed)
CARDIOLOGY CONSULT NOTE  Patient ID: [name] MRN: [id number] DOB/AGE: April 09, 1960 54 y.o.  Admit date: 01/11/15 Referring Physician  Devoria Albe, MD Primary Physician:  PROVIDER NOT IN SYSTEM Reason for Consultation  Cardiopulmonary arrest  HPI: [name]  is a 53 y.o. female  With h/o diabetes mellitus, hypertension, hyperlipidemia, history of known tobacco use disorder and peripheral arterial disease and history of left carotid to subclavian bypass in 2010, history of right iliac artery stenting in the remote past, mild noncritical CAD with coronary angiography on 09/21/2013 revealing hyperdynamic left ventricle and mild to moderate diffuse disease. She also has history of COPD and ongoing tobacco use disorder.  Now admitted with cardiopulmonary arrest, patient activated EMS stating that she is unable to breathe, she was found to be in pulseless mode and unresponsive when the first responders arrived leading to transient CPR a 5-6 minutes and eventually transferred to [facility] for further management. She was intubated in the field. There is question about aspiration. Presently on normothermia protocol. She is presently on diprivan  and also fentanyl drip. No family members present during exam. Past Medical History  Diagnosis Date  . Hypertension   . High cholesterol   . Diabetes mellitus   . Coronary artery disease   . Heart murmur   . CHF (congestive heart failure) 2010  . Bipolar 1 disorder 11/26/2011    "not on medicine"  . Anxiety      Past Surgical History  Procedure Laterality Date  . Tubal ligation    . Cardiac catheterization  ~ 2011; 11/26/2011  . Left heart catheterization with coronary angiogram N/A 11/26/2011    Procedure: LEFT HEART CATHETERIZATION WITH CORONARY ANGIOGRAM;  Surgeon: Pamella Pert, MD;  Location: [facility] CATH LAB;  Service: Cardiovascular;  Laterality: N/A;  . Left heart catheterization with coronary angiogram N/A 09/21/2013     Procedure: LEFT HEART CATHETERIZATION WITH CORONARY ANGIOGRAM;  Surgeon: Pamella Pert, MD;  Location: [facility] CATH LAB;  Service: Cardiovascular;  Laterality: N/A;     No family history on file. father died at age of 56 with myocardial infarction. 2 older and 2 younger sisters and one younger brother with no significant CAD. Mother died in her 63s.  Social History: Social History   Social History  . Marital Status: Widowed    Spouse Name: N/A  . Number of Children: N/A  . Years of Education: N/A   Occupational History  . Not on file.   Social History Main Topics  . Smoking status: Current Every Day Smoker -- 0.50 packs/day    Types: Cigarettes  . Smokeless tobacco: Never Used  . Alcohol Use: No  . Drug Use: No  . Sexual Activity: No   Other Topics Concern  . Not on file   Social History Narrative     Prescriptions prior to admission  Medication Sig Dispense Refill Last Dose  . aspirin EC 81 MG tablet Take 81 mg by mouth daily.   09/19/2013 at Unknown time  . atorvastatin (LIPITOR) 20 MG tablet Take 20 mg by mouth daily.   09/19/2013 at Unknown time  . lisinopril (PRINIVIL,ZESTRIL) 20 MG tablet Take 20 mg by mouth daily.     . metFORMIN (GLUCOPHAGE) 500 MG tablet Take 500 mg by mouth at bedtime.     . metoprolol succinate (TOPROL-XL) 25 MG 24 hr tablet Take 25 mg by mouth daily.   09/19/2013 at Unknown time     ROS: Unable to obtain.  Physical Exam: Blood pressure 82/51, pulse 68, temperature 97.3 F (36.3 C), temperature source Core (Comment), resp. rate 14, height  (1.575 m), weight 61 kg (134 lb 7.7 oz), last menstrual period 06/26/2011, SpO2 100 %.   General appearance: Intubated and sedated Lungs: clear to auscultation bilaterally Heart: regular rate and rhythm, S1, S2 normal, no murmur, click, rub or gallop Abdomen: soft, non-tender; bowel sounds normal; no masses,  no organomegaly Extremities: extremities normal, atraumatic, no cyanosis or edema and cool  extremities Pulses: soft bilateral carotid bruit. Fem pulse normal. Pop and pedal pulses could not be felt. Normal capillary fill.  Labs:   Lab Results  Component Value Date   WBC 13.6* Jan 01, 2015   HGB 15.0 2015-01-01   HCT 44.0 January 01, 2015   MCV 92.8 Jan 01, 2015   PLT 160 01-Jan-2015    Recent Labs Lab 2015/01/01 0411  NA 140  K 4.6  CL 104  CO2 23  BUN 13  CREATININE 1.30*  CALCIUM 8.6*  GLUCOSE 175*    Lipid Panel     Component Value Date/Time   CHOL * 02/13/2007 0630    207        ATP III CLASSIFICATION:  <200     mg/dL   Desirable  540-981  mg/dL   Borderline High  >=191    mg/dL   High   TRIG 478 29/56/2130 0630   HDL 27* 02/13/2007 0630   CHOLHDL 7.7 02/13/2007 0630   VLDL 29 02/13/2007 0630   LDLCALC * 02/13/2007 0630    151        Total Cholesterol/HDL:CHD Risk Coronary Heart Disease Risk Table                     Men   Women  1/2 Average Risk   3.4   3.3    BNP (last 3 results)  Recent Labs  01-Jan-2015 0044  BNP 1245.0*    ProBNP (last 3 results) No results for input(s): PROBNP in the last 8760 hours.  HEMOGLOBIN A1C No results found for: HGBA1C, MPG  Cardiac Panel (last 3 results)  Recent Labs  2015-01-01 0411 01-01-2015 0845  TROPONINI 0.17* 0.18*    Lab Results  Component Value Date   CKTOTAL 102 10/26/2010   CKMB 6.9* 10/26/2010   TROPONINI 0.18* 01/01/2015     TSH No results for input(s): TSH in the last 8760 hours.  EKG 2015/01/01 on admission reveals sinus tachycardia, left renal abnormality, incomplete left bundle branch block, LVH with repolarization abnormality, cannot exclude lateral ischemia. EKG performed earlier this morning reveals normal sinus rhythm, left atrial enlargement, incomplete left bundle branch block/IVCD, cannot exclude lateral ischemia versus LVH with repolarization abnormality. V5 V6 T-wave inversion improved. Overall no significant change from 01/18/2014.  Heart Cath 09/21/13: Mid Cx 40%, OM-1 50%. Mild  disease in LAD. and RCA. EF 80% with intraventricular  LV PG. No change since 09/10/2010 and 11/26/2011. Moderate pulmonary hypertension by right heart cath in 2012.  Echo- 08/09/13 1. Left ventricle cavity is normal in size. Severe concentric hypertrophy of the left ventricle. There is LV cavity obliteration during systole, No SAM was seen. Normal systolic function with calculated ejection fraction is 55%.  Grade 1 diastolic dysfunction with elevated LV filling pressure. Peak LVOT gradient is 73 mm of Hg, it is possible it may have been overestimated due to probable mixing of mitral flow. 2. Left atrial cavity is mildly dilated. 3. Normal mitral valve with mild regurgitation. 4. Normal  tricuspid valve with mild regurgitation. No pulmonary hypertension seen. 5. Small pericardial effusion. No findings of temponade No significant change from  Echo 12/25/11: Severe LVH, consider hypertrophic cardiomyopathy. LVEF > 70%. Intraventricular PG suspected.    Radiology: Dg Chest Port 1 View  01/08/2015   CLINICAL DATA:  Intubation and OG tube.  Shortness of breath.  EXAM: PORTABLE CHEST - 1 VIEW  COMPARISON:  01-08-15  FINDINGS: Endotracheal tube tip measures 2.1 cm above the carina. Enteric tube tip is off the field of view but below the left hemidiaphragm. Multiple EKG wires. Mild cardiac enlargement with mild pulmonary vascular congestion and probable perihilar edema. No blunting of costophrenic angles. No pneumothorax. Mediastinal contours appear intact.  IMPRESSION: Appliances appear in satisfactory position. Mild cardiac enlargement with pulmonary vascular congestion and edema.   Electronically Signed   By: Burman Nieves M.D.   On: 2015-01-08 03:49   Dg Chest Port 1 View  08-Jan-2015   CLINICAL DATA:  Difficulty breathing. Episode of unresponsiveness. Apnea and asystole. Shortness of breath.  EXAM: PORTABLE CHEST - 1 VIEW  COMPARISON:  05/26/2013  FINDINGS: Backboard and equipment artifact. Endotracheal  tube placed with tip measuring 3.4 cm above the carina. Enteric tube is present. Tip is off the field of view but is below the left hemidiaphragm. There is focal infiltration or atelectasis in the right lung base. Left lung appears grossly clear. Mild cardiac enlargement and pulmonary vascular congestion. No pneumothorax. No blunting of costophrenic angles.  IMPRESSION: Appliances appear to be in satisfactory position. Cardiac enlargement with mild vascular congestion. Focal atelectasis or consolidation in the right lung base.   Electronically Signed   By: Burman Nieves M.D.   On: 01-08-15 02:23    Scheduled Meds: . antiseptic oral rinse  7 mL Mouth Rinse 10 times per day  . chlorhexidine gluconate  15 mL Mouth Rinse BID  . famotidine (PEPCID) IV  20 mg Intravenous Q12H  . heparin  5,000 Units Subcutaneous 3 times per day  . insulin aspart  2-6 Units Subcutaneous 6 times per day   Continuous Infusions: . sodium chloride 10 mL/hr at 01-08-15 1030  . fentaNYL infusion INTRAVENOUS 50 mcg/hr (2015/01/08 1053)  . norepinephrine (LEVOPHED) Adult infusion Stopped (01-08-2015 0915)  . propofol (DIPRIVAN) infusion 50 mcg/kg/min (01/08/15 1040)   PRN Meds:.fentaNYL  ASSESSMENT AND PLAN:   1. Respiratory arrest due to COPD and ongoing tobacco use disorder. 2. Abnormal S. Troponin, secondary lead, flat and does not suggest ACS. 3. DM with stage 3 CKD. Controlled 4. Hypertension 5. Hyperlipidemia 6. Tobacco use disorder.  Recommendation: From cardiac standpoint not much to offer, I do not suspect ACS. I have not made any changes to her medications or management for now. I will continue to follow sidelines.   Yates Decamp, MD January 08, 2015, 10:58 AM [facility] Pager: [phone] Office: [phone] If no answer Cell [phone]

## 2014-12-12 NOTE — ED Provider Notes (Signed)
CSN: [id number]     Arrival date & time    History   First MD Initiated Contact with Patient 12/27/2014 0038    Chief Complaint  Patient presents with  . Respiratory Arrest   Level V caveat for unresponsiveness  (Consider location/radiation/quality/duration/timing/severity/associated sxs/prior Treatment) HPI   Pt seen on arrival  EMS report patient lives alone. She called 911 for shortness of breath. On their arrival she was on the porch and was unresponsive. They report she had agonal respirations and had no pulse. They started CPR and put in a [facility] airway.Her initial rhythm was asystole.  After which they got a rhythm with heart rate and blood pressure of 120 systolic. No drugs were given. Her CBG was 179. EMS started giving her cold fluids. Per her old chart patient has a history hypertension, diabetes, coronary artery disease with one stent, and congestive heart failure.    Past Medical History  Diagnosis Date  . Hypertension   . High cholesterol   . Diabetes mellitus   . Coronary artery disease   . Heart murmur   . CHF (congestive heart failure) 2010  . Bipolar 1 disorder 11/26/2011    "not on medicine"  . Anxiety    Past Surgical History  Procedure Laterality Date  . Tubal ligation    . Cardiac catheterization  ~ 2011; 11/26/2011  . Left heart catheterization with coronary angiogram N/A 11/26/2011    Procedure: LEFT HEART CATHETERIZATION WITH CORONARY ANGIOGRAM;  Surgeon: Pamella Pert, MD;  Location: [facility] CATH LAB;  Service: Cardiovascular;  Laterality: N/A;  . Left heart catheterization with coronary angiogram N/A 09/21/2013    Procedure: LEFT HEART CATHETERIZATION WITH CORONARY ANGIOGRAM;  Surgeon: Pamella Pert, MD;  Location: [facility] CATH LAB;  Service: Cardiovascular;  Laterality: N/A;   No family history on file. Social History  Substance Use Topics  . Smoking status: Current Every Day Smoker -- 0.50 packs/day    Types: Cigarettes  . Smokeless tobacco: Never Used   . Alcohol Use: No  lives alone  OB History    No data available     Review of Systems  Unable to perform ROS: Intubated      Allergies  Hydrocodone and Erythromycin  Home Medications   Prior to Admission medications   Medication Sig Start Date End Date Taking? Authorizing Provider  aspirin EC 81 MG tablet Take 81 mg by mouth daily.    Historical Provider, MD  atorvastatin (LIPITOR) 20 MG tablet Take 20 mg by mouth daily.    Historical Provider, MD  lisinopril (PRINIVIL,ZESTRIL) 20 MG tablet Take 20 mg by mouth daily.    Historical Provider, MD  metFORMIN (GLUCOPHAGE) 500 MG tablet Take 500 mg by mouth at bedtime.    Historical Provider, MD  metoprolol succinate (TOPROL-XL) 25 MG 24 hr tablet Take 25 mg by mouth daily.    Historical Provider, MD   BP 212/136 mmHg  Pulse 110  Resp 18  Wt 160 lb (72.576 kg)  SpO2 99%  LMP 06/26/2011  Vital signs normal except for hypertension and tachycardia  Physical Exam  Constitutional: She appears well-developed and well-nourished.  Non-toxic appearance. She does not appear ill. She appears distressed.  Patient is vomiting around the [facility] airway.  HENT:  Head: Normocephalic and atraumatic.  Right Ear: External ear normal.  Left Ear: External ear normal.  Nose: Nose normal. No mucosal edema or rhinorrhea.  Mouth/Throat: Mucous membranes are normal. No dental abscesses or uvula swelling.  edentulous  Eyes: Conjunctivae and EOM are normal. Pupils are equal, round, and reactive to light.  Neck: Normal range of motion and full passive range of motion without pain. Neck supple.  Cardiovascular: Normal rate, regular rhythm and normal heart sounds.  Exam reveals no gallop and no friction rub.   No murmur heard. Pulmonary/Chest: Breath sounds normal. She is in respiratory distress. She has no wheezes. She has no rhonchi. She has no rales. She exhibits no tenderness and no crepitus.  Patient has diffuse rales.  Abdominal: Soft. Normal  appearance and bowel sounds are normal. She exhibits distension. There is no tenderness. There is no rebound and no guarding.  Musculoskeletal: Normal range of motion.  IO in right lower leg  Neurological: She is alert. She has normal strength. No cranial nerve deficit.  Skin: Skin is warm, dry and intact. No rash noted. No erythema. No pallor.  Patient's face is plethoric  Psychiatric: Her speech is normal. Her mood appears not anxious.  Nursing note and vitals reviewed.   ED Course  Procedures (including critical care time)  Medications  propofol (DIPRIVAN) 1000 MG/100ML infusion (50 mcg/kg/min  72.6 kg Intravenous Rate/Dose Change 01/06/2015 0330)  chlorhexidine gluconate (PERIDEX) 0.12 % solution 15 mL (not administered)  antiseptic oral rinse solution (CORINZ) (7 mLs Mouth Rinse Given 12/14/2014 0521)  heparin injection 5,000 Units (not administered)  famotidine (PEPCID) IVPB 20 mg premix (not administered)  insulin aspart (novoLOG) injection 2-6 Units (2 Units Subcutaneous Given 12/31/2014 0533)  0.9 %  sodium chloride infusion (2,000 mLs Intravenous Transfusing/Transfer 12/11/2014 0147)  aspirin suppository 300 mg (300 mg Rectal Given 12/13/2014 0053)  propofol (DIPRIVAN) 10 mg/mL bolus/IV push 36.3 mg (0 mg Intravenous Stopped 12/21/2014 0113)  furosemide (LASIX) injection 80 mg (80 mg Intravenous Given 12/11/2014 0142)  chlorhexidine (PERIDEX) 0.12 % solution 15 mL (15 mLs Mouth/Throat Given 12/14/2014 0310)  LORazepam (ATIVAN) injection 4 mg (4 mg Intravenous Given 12/19/2014 0326)  LORazepam (ATIVAN) 2 MG/ML injection (  Duplicate 12/23/2014 0330)  fentaNYL (SUBLIMAZE) 100 MCG/2ML injection (  Duplicate 01/01/2015 0445)  fentaNYL (SUBLIMAZE) injection 50 mcg (50 mcg Intravenous Given by Other 12/17/2014 0436)   Patient was vomiting despite having a King airway in. The [facility] airway was removed and patient was bagged with bag valve mask and patient was suctioned. She was starting to have some spontaneous mild movements or  her head and mild moaning and was having some spontaneous respirations. She was intubated without RSI. Patient had 2 peripheral IV started in her antecubital areas. Cooling process was started.  PT is having some secretions in her ETT c/w aspiration  [id number] D/W Dr Delton Coombes PCCM, wants to cool patient at this time, wants transferred to [facility].  EMS is going to transport to [facility].   01:18 Dr Jacinto Halim, Cardiology, will consult on patient.   After reviewing her laboratory results and her chest x-ray patient was given Lasix 80 mg IV. Her official CXR report was not done until after she had left this ED.   EMS timeline 911 was called at 2339, they arrived at 2346, CPR started at 2347, pulse obtained at 2355  INTUBATION Performed by: Derico Mitton L  Required items: required blood products, implants, devices, and special equipment available Patient identity confirmed: provided demographic data and hospital-assigned identification number Time out: Immediately prior to procedure a "time out" was called to verify the correct patient, procedure, equipment, support staff and site/side marked as required.  Indications: respiratory distresss  Intubation method: Glidescope Laryngoscopy  Preoxygenation:  BVM  Tube Size: 7.5 cuffed  Post-procedure assessment: chest rise and ETCO2 monitor Breath sounds: equal and absent over the epigastrium Tube secured with: ETT holder Chest x-ray interpreted by radiologist and me.  Chest x-ray findings: endotracheal tube in appropriate position  Patient tolerated the procedure well with no immediate complications.     Labs Review Results for orders placed or performed during the hospital encounter of January 05, 2015  MRSA PCR Screening  Result Value Ref Range   MRSA by PCR NEGATIVE NEGATIVE  Blood gas, arterial  Result Value Ref Range   FIO2 100.00    Delivery systems VENTILATOR    Mode PRESSURE REGULATED VOLUME CONTROL    VT 420 mL   LHR 16 resp/min   Peep/cpap 5.0  cm H20   pH, Arterial 7.195 (LL) 7.350 - 7.450   pCO2 arterial 54.8 (H) 35.0 - 45.0 mmHg   pO2, Arterial 172.0 (H) 80.0 - 100.0 mmHg   Bicarbonate 18.1 (L) 20.0 - 24.0 mEq/L   TCO2 18.6 0 - 100 mmol/L   Acid-Base Excess 6.4 (H) 0.0 - 2.0 mmol/L   O2 Saturation 98.5 %   Patient temperature 97.2    Map PENDING cmH20   Oxygen index PENDING    Collection site RIGHT RADIAL    Drawn by [id number]    Sample type ARTERIAL    Allens test (pass/fail) PASS PASS   Mechanical Rate PENDING   Basic metabolic panel  Result Value Ref Range   Sodium 138 135 - 145 mmol/L   Potassium 4.2 3.5 - 5.1 mmol/L   Chloride 107 101 - 111 mmol/L   CO2 20 (L) 22 - 32 mmol/L   Glucose, Bld 347 (H) 65 - 99 mg/dL   BUN 13 6 - 20 mg/dL   Creatinine, Ser 0.98 (H) 0.44 - 1.00 mg/dL   Calcium 8.3 (L) 8.9 - 10.3 mg/dL   GFR calc non Af Amer 44 (L) >60 mL/min   GFR calc Af Amer 51 (L) >60 mL/min   Anion gap 11 5 - 15  CBC  Result Value Ref Range   WBC 14.9 (H) 4.0 - 10.5 K/uL   RBC 4.52 3.87 - 5.11 MIL/uL   Hemoglobin 14.4 12.0 - 15.0 g/dL   HCT 11.9 14.7 - 82.9 %   MCV 94.0 78.0 - 100.0 fL   MCH 31.9 26.0 - 34.0 pg   MCHC 33.9 30.0 - 36.0 g/dL   RDW 56.2 13.0 - 86.5 %   Platelets 258 150 - 400 K/uL  APTT  Result Value Ref Range   aPTT 28 24 - 37 seconds  Protime-INR  Result Value Ref Range   Prothrombin Time 13.7 11.6 - 15.2 seconds   INR 1.03 0.00 - 1.49  Brain natriuretic peptide  Result Value Ref Range   B Natriuretic Peptide 1245.0 (H) 0.0 - 100.0 pg/mL  I-Stat CG4 Lactic Acid, ED  (not at [facility])  Result Value Ref Range   Lactic Acid, Venous 5.05 (HH) 0.5 - 2.0 mmol/L   Comment MD NOTIFIED, REPEAT TEST   I-stat troponin, ED  (not at [facility])  Result Value Ref Range   Troponin i, poc 0.11 (HH) 0.00 - 0.08 ng/mL   Comment MD NOTIFIED, REPEAT TEST    Comment 3           Laboratory interpretation all normal except leukocytosis, + troponin, elevated LA, elevated BNP, respiratory acidosis/metabolic  acidosis on ABG     Imaging Review   Dg Chest  Port 1 View  December 18, 2014   CLINICAL DATA:  Difficulty breathing. Episode of unresponsiveness. Apnea and asystole. Shortness of breath.  EXAM: PORTABLE CHEST - 1 VIEW  COMPARISON:  05/26/2013  FINDINGS: Backboard and equipment artifact. Endotracheal tube placed with tip measuring 3.4 cm above the carina. Enteric tube is present. Tip is off the field of view but is below the left hemidiaphragm. There is focal infiltration or atelectasis in the right lung base. Left lung appears grossly clear. Mild cardiac enlargement and pulmonary vascular congestion. No pneumothorax. No blunting of costophrenic angles.  IMPRESSION: Appliances appear to be in satisfactory position. Cardiac enlargement with mild vascular congestion. Focal atelectasis or consolidation in the right lung base.   Electronically Signed   By: Burman Nieves M.D.   On: 2014/12/18 02:23   I have personally reviewed and evaluated these images and lab results as part of my medical decision-making.   EKG Interpretation None      ED ECG REPORT   Date: 12-18-14  Rate: 108  Rhythm: sinus tachycardia  QRS Axis: normal  Intervals: QT prolonged  ST/T Wave abnormalities: Marked flipped T waves in inferior lateral leads, present on old EKG but deeper inversion on this EKG  Conduction Disutrbances:nonspecific intraventricular conduction delay, LVH  Narrative Interpretation:   Old EKG Reviewed: changes noted from 22 Jun 2013   I have personally reviewed the EKG tracing and agree with the computerized printout as noted.   MDM   Final diagnoses:  Respiratory arrest  Cardiac arrest due to respiratory disorder    Plan transfer to [facility]   Devoria Albe, MD, FACEP  CRITICAL CARE Performed by: Devoria Albe L Total critical care time: 32 min Critical care time was exclusive of separately billable procedures and treating other patients. Critical care was necessary to treat or prevent imminent or  life-threatening deterioration. Critical care was time spent personally by me on the following activities: development of treatment plan with patient and/or surrogate as well as nursing, discussions with consultants, evaluation of patient's response to treatment, examination of patient, obtaining history from patient or surrogate, ordering and performing treatments and interventions, ordering and review of laboratory studies, ordering and review of radiographic studies, pulse oximetry and re-evaluation of patient's condition.        Devoria Albe, MD 12/18/14 [phone]

## 2014-12-12 NOTE — Progress Notes (Signed)
Tracheal Aspirate obtained/labelled/sent to Lab. 

## 2014-12-12 NOTE — Procedures (Signed)
Received pt from [facility].  Pt placed on previous vent settings. No complications.

## 2014-12-12 NOTE — Progress Notes (Signed)
eLink Physician-Brief Progress Note Patient Name: Jaskiran Pata DOB: Feb 12, 1961 MRN: [id number]   Date of Service  12/31/2014  HPI/Events of Note  Sudden rise in BP, tachy EEG - burst suppression  eICU Interventions  ? Seizure vs htn Max propofol gtt, versed 2 mg x1 Then labetalol if remains high May need CT imaging in am     Intervention Category Major Interventions: Hypertension - evaluation and management  ALVA,RAKESH V. 12/28/2014, 8:22 PM

## 2014-12-12 NOTE — Procedures (Signed)
Central Venous Catheter Insertion Procedure Note Voula Waln [id number] [birth year]  Procedure: Insertion of Central Venous Catheter Indications: Assessment of intravascular volume, Drug and/or fluid administration and Frequent blood sampling  Procedure Details Consent: Risks of procedure as well as the alternatives and risks of each were explained to the (patient/caregiver).  Consent for procedure obtained. Time Out: Verified patient identification, verified procedure, site/side was marked, verified correct patient position, special equipment/implants available, medications/allergies/relevent history reviewed, required imaging and test results available.  Performed  Maximum sterile technique was used including antiseptics, cap, gloves, gown, hand hygiene, mask and sheet. Skin prep: Chlorhexidine; local anesthetic administered A antimicrobial bonded/coated triple lumen catheter was placed in the left subclavian vein using the Seldinger technique.  Evaluation Blood flow good Complications: No apparent complications Patient did tolerate procedure well. Chest X-ray ordered to verify placement.  CXR: pending.  U/S used in placement.  Syla Devoss 12/19/2014, 12:23 PM

## 2014-12-12 NOTE — Progress Notes (Signed)
Gave family three pairs of patient's earrings.

## 2014-12-12 NOTE — H&P (Signed)
PULMONARY / CRITICAL CARE MEDICINE   Name: [name] MRN: [id number] DOB: Jun 14, 1960    ADMISSION DATE:  01-05-15 CONSULTATION DATE:  2015-01-05   REFERRING MD :  [facility]  CHIEF COMPLAINT:  Cardiac arrest  INITIAL PRESENTATION: Transferred to [facility] via EMS after cardiac arrest  STUDIES:    SIGNIFICANT EVENTS: Intubated in ED 2015/01/05 Cooled to [facility] 01/05/2015   HISTORY OF PRESENT ILLNESS:  [name] is a 54 y/o woman who  has a past medical history of Hypertension; High cholesterol; Diabetes mellitus; Coronary artery disease; Heart murmur; CHF (congestive heart failure) (2010); Bipolar 1 disorder (11/26/2011); and Anxiety.  Per report she called EMS with the complain of shortness of breath.  When they arrived they found her pulseless.  She underwent about 6 minutes of CPR prior to ROSC.  She was intubated in the field with a [facility] airway which was changed to an ET tube at [facility].  Cooling was initiated by EMS en route to the ED.  She did have an episode of emesis with the [facility] airway in place and is thought to have aspirated.    PAST MEDICAL HISTORY :   has a past medical history of Hypertension; High cholesterol; Diabetes mellitus; Coronary artery disease; Heart murmur; CHF (congestive heart failure) (2010); Bipolar 1 disorder (11/26/2011); and Anxiety.  has past surgical history that includes Tubal ligation; Cardiac catheterization (~ 2011; 11/26/2011); left heart catheterization with coronary angiogram (N/A, 11/26/2011); and left heart catheterization with coronary angiogram (N/A, 09/21/2013). Prior to Admission medications   Medication Sig Start Date End Date Taking? Authorizing Provider  aspirin EC 81 MG tablet Take 81 mg by mouth daily.    Historical Provider, MD  atorvastatin (LIPITOR) 20 MG tablet Take 20 mg by mouth daily.    Historical Provider, MD  lisinopril (PRINIVIL,ZESTRIL) 20 MG tablet Take 20 mg by mouth daily.    Historical Provider, MD  metFORMIN (GLUCOPHAGE) 500 MG tablet  Take 500 mg by mouth at bedtime.    Historical Provider, MD  metoprolol succinate (TOPROL-XL) 25 MG 24 hr tablet Take 25 mg by mouth daily.    Historical Provider, MD   Allergies  Allergen Reactions  . Hydrocodone Itching  . Erythromycin Rash    FAMILY HISTORY:  has no family status information on file.  SOCIAL HISTORY:  reports that she has been smoking Cigarettes.  She has been smoking about 0.50 packs per day. She has never used smokeless tobacco. She reports that she does not drink alcohol or use illicit drugs.  REVIEW OF SYSTEMS:  Could not be obtained as patient is intubated and sedated.   SUBJECTIVE:   VITAL SIGNS: Temp:  [94.3 F (34.6 C)-97.5 F (36.4 C)] 95 F (35 C) (09/05 0400) Pulse Rate:  [89-110] 90 (09/05 0330) Resp:  [18-32] 22 (09/05 0330) BP: (135-212)/(76-142) 143/76 mmHg (09/05 0330) SpO2:  [96 %-100 %] 100 % (09/05 0330) FiO2 (%):  [65 %-100 %] 100 % (09/05 0254) Weight:  [61 kg (134 lb 7.7 oz)-72.576 kg (160 lb)] 61 kg (134 lb 7.7 oz) (09/05 0254) HEMODYNAMICS:   VENTILATOR SETTINGS: Vent Mode:  [-] PRVC FiO2 (%):  [65 %-100 %] 100 % Set Rate:  [14 bmp] 14 bmp Vt Set:  [420 mL] 420 mL PEEP:  [5 cmH20] 5 cmH20 Plateau Pressure:  [22 cmH20] 22 cmH20 INTAKE / OUTPUT:  Intake/Output Summary (Last 24 hours) at 05-Jan-2015 0451 Last data filed at 2015-01-05 0330  Gross per 24 hour  Intake  0 ml  Output    700 ml  Net   -700 ml    PHYSICAL EXAMINATION: Physical Exam  Constitutional: She appears well-developed and well-nourished.  HENT:  Head: Normocephalic and atraumatic.  ETT in place  Eyes: Conjunctivae are normal. Pupils are equal, round, and reactive to light. No scleral icterus.  Neck: Neck supple. No JVD present. No thyromegaly present.  Cardiovascular: Normal rate, regular rhythm, normal heart sounds and intact distal pulses.  Exam reveals no gallop and no friction rub.   No murmur heard. Pulmonary/Chest:  Diminished breath sounds  bilaterally  Abdominal: Soft. Bowel sounds are normal. She exhibits no distension and no mass.  Musculoskeletal: She exhibits no edema.  Lymphadenopathy:    She has no cervical adenopathy.  Neurological:  Sedated.  Rhythmic repeated extension of both arms.  Skin: Skin is dry. No rash noted.     LABS:  CBC  Recent Labs Lab 12/29/2014 0045  WBC 14.9*  HGB 14.4  HCT 42.5  PLT 258   Coag's  Recent Labs Lab 01/04/2015 0045  APTT 28  INR 1.03   BMET  Recent Labs Lab 12/30/2014 0045  NA 138  K 4.2  CL 107  CO2 20*  BUN 13  CREATININE 1.34*  GLUCOSE 347*   Electrolytes  Recent Labs Lab 01/06/2015 0045  CALCIUM 8.3*   Sepsis Markers  Recent Labs Lab 12/28/2014 0044 12/10/2014 0110  LATICACIDVEN 5.0* 5.05*   ABG  Recent Labs Lab 12/19/2014 0115  PHART 7.195*  PCO2ART 54.8*  PO2ART 172.0*   Liver Enzymes No results for input(s): AST, ALT, ALKPHOS, BILITOT, ALBUMIN in the last 168 hours. Cardiac Enzymes No results for input(s): TROPONINI, PROBNP in the last 168 hours. Glucose  Recent Labs Lab 12/16/2014 0307  GLUCAP 192*    Imaging Dg Chest Port 1 View  12/18/2014   CLINICAL DATA:  Intubation and OG tube.  Shortness of breath.  EXAM: PORTABLE CHEST - 1 VIEW  COMPARISON:  12/11/2014  FINDINGS: Endotracheal tube tip measures 2.1 cm above the carina. Enteric tube tip is off the field of view but below the left hemidiaphragm. Multiple EKG wires. Mild cardiac enlargement with mild pulmonary vascular congestion and probable perihilar edema. No blunting of costophrenic angles. No pneumothorax. Mediastinal contours appear intact.  IMPRESSION: Appliances appear in satisfactory position. Mild cardiac enlargement with pulmonary vascular congestion and edema.   Electronically Signed   By: Burman Nieves M.D.   On: 12/31/2014 03:49   Dg Chest Port 1 View  12/31/2014   CLINICAL DATA:  Difficulty breathing. Episode of unresponsiveness. Apnea and asystole. Shortness of  breath.  EXAM: PORTABLE CHEST - 1 VIEW  COMPARISON:  05/26/2013  FINDINGS: Backboard and equipment artifact. Endotracheal tube placed with tip measuring 3.4 cm above the carina. Enteric tube is present. Tip is off the field of view but is below the left hemidiaphragm. There is focal infiltration or atelectasis in the right lung base. Left lung appears grossly clear. Mild cardiac enlargement and pulmonary vascular congestion. No pneumothorax. No blunting of costophrenic angles.  IMPRESSION: Appliances appear to be in satisfactory position. Cardiac enlargement with mild vascular congestion. Focal atelectasis or consolidation in the right lung base.   Electronically Signed   By: Burman Nieves M.D.   On: 12/10/2014 02:23     ASSESSMENT / PLAN:  PULMONARY OETT 7.5 ETT A:Intubated during arrest P:   Lung protective ventilation Wean as tolerated SBT when meets criteria Liikely aspiration event - monitor for development  of pneumonia  CARDIOVASCULAR A: Asystolic arrest P:  Hypothermia 36C protocol Cardiology consult   RENAL A:  At risk for electrolyte abnormalities P:   Monitor electrolytes regularly Strict Is/Os  GASTROINTESTINAL A:  Stress ulcer risk P:   Stress ulcer prophylaxis    NEUROLOGIC A:  Unknown down time - posturing vs. Seizure activity on arrival P:   RASS goal: 0 Abnormal movements resolved with propofol/ativan EEG per protocol   FAMILY  - Updates:   - Inter-disciplinary family meet or Palliative Care meeting due by:  day 7    TODAY'S SUMMARY: Cardiac arrest, possibly pulmonary cause.  Intubated and cooled.       Pulmonary and Critical Care Medicine [facility] Pager: [phone]  01/02/2015, 4:51 AM

## 2014-12-12 NOTE — ED Notes (Signed)
Patient called 911 herself with difficulty breathing, went unresponsive while on the phone. Found in a chair on the front porch unresponsive, with apnea and asystole on monitor.

## 2014-12-12 NOTE — Procedures (Signed)
History: 54 yo M with cardiac arrest  Sedation: Propofol  Technique: This is a 19 channel routine scalp EEG performed at the bedside with bipolar and monopolar montages arranged in accordance to the international 10/20 system of electrode placement. One channel was dedicated to EKG recording.    Background: Initially, the exam was markedly compromised by muscle artifact, though throughout this period only irregular slow activity is sometimes seen. Due to this, rocuronium was administered following which the appearance is a burst suppression pattern. The interburst interval is 2 - 8 seconds, with most being 2 - 5 seconds. The bursts last 1 - 3 seconds and consist predominantly of theta activity though occasionally some alpha is seen as well.  This pattern lasts the duration of the recording.   Photic stimulation: Physiologic driving is not performed.   EEG Abnormalities: 1) Burst suppression pattern.   Clinical Interpretation: This EEG is consistent with a profound generalized cerebral dysfunction as can be seen in medication induced coma(e.g. with propofol) as well as with severe anoxic brain injury.   Ritta Slot, MD [facility] [phone]  If 7pm- 7am, please page neurology on call as listed in AMION.

## 2014-12-12 NOTE — Progress Notes (Signed)
EEG Completed; Results Pending  

## 2014-12-12 NOTE — Procedures (Signed)
Arterial Catheter Insertion Procedure Note [name] [id number] 1960-12-23  Procedure: Insertion of Arterial Catheter  Indications: Blood pressure monitoring and Frequent blood sampling  Procedure Details Consent: Unable to obtain consent because of altered level of consciousness. Time Out: Verified patient identification, verified procedure, site/side was marked, verified correct patient position, special equipment/implants available, medications/allergies/relevent history reviewed, required imaging and test results available.  Performed  Maximum sterile technique was used including antiseptics, cap, gloves, gown, hand hygiene, mask and sheet. Skin prep: Chlorhexidine; local anesthetic administered 20 gauge catheter was inserted into left radial artery using the Seldinger technique.  Evaluation Blood flow good; BP tracing good. Complications: No apparent complications.   Gala Romney 2015-01-08

## 2014-12-12 NOTE — Progress Notes (Signed)
Orogastric tube okay to use per Dr. Molli Knock.

## 2014-12-12 NOTE — Progress Notes (Signed)
eLink Physician-Brief Progress Note Patient Name: Rozella Servello DOB: 1960/05/09 MRN: [id number]   Date of Service  12/10/2014  HPI/Events of Note    eICU Interventions  ICU hyperglycemia protocol started     Intervention Category Intermediate Interventions: Hyperglycemia - evaluation and treatment  Wang Granada S. 12/29/2014, 4:52 AM

## 2014-12-12 NOTE — ED Notes (Signed)
CRITICAL VALUE ALERT  Critical value received: lactic acid 5.0  Date of notification:  27-Dec-2014  Time of notification:  0126  Critical value read back:Yes.    Nurse who received alert:  Neldon Mc RN  MD notified (1st page):  Dr. Lynelle Doctor  Time of first page:  0126

## 2014-12-12 NOTE — Care Management Note (Signed)
Case Management Note  Patient Details  Name: [name] MRN: [id number] Date of Birth: [birth year]  Subjective/Objective:          Adm w cardiac arrest, vent         Action/Plan: lives at home   Expected Discharge Date:                  Expected Discharge Plan:  Home w Home Health Services  In-House Referral:     Discharge planning Services     Post Acute Care Choice:    Choice offered to:     DME Arranged:    DME Agency:     HH Arranged:    HH Agency:     Status of Service:     Medicare Important Message Given:    Date Medicare IM Given:    Medicare IM give by:    Date Additional Medicare IM Given:    Additional Medicare Important Message give by:     If discussed at Long Length of Stay Meetings, dates discussed:    Additional Comments:ur review done  Hanley Hays, RN 12/26/2014, 10:00 AM

## 2014-12-13 ENCOUNTER — Encounter (HOSPITAL_COMMUNITY): Payer: Self-pay | Admitting: Neurology

## 2014-12-13 ENCOUNTER — Inpatient Hospital Stay (HOSPITAL_COMMUNITY): Payer: Medicare HMO

## 2014-12-13 DIAGNOSIS — I469 Cardiac arrest, cause unspecified: Secondary | ICD-10-CM

## 2014-12-13 DIAGNOSIS — E876 Hypokalemia: Secondary | ICD-10-CM

## 2014-12-13 LAB — GLUCOSE, CAPILLARY
GLUCOSE-CAPILLARY: 96 mg/dL (ref 65–99)
GLUCOSE-CAPILLARY: 129 mg/dL — AB (ref 65–99)
GLUCOSE-CAPILLARY: 99 mg/dL (ref 65–99)
Glucose-Capillary: 123 mg/dL — ABNORMAL HIGH (ref 65–99)
Glucose-Capillary: 74 mg/dL (ref 65–99)
Glucose-Capillary: 122 mg/dL — ABNORMAL HIGH (ref 65–99)
Glucose-Capillary: 131 mg/dL — ABNORMAL HIGH (ref 65–99)

## 2014-12-13 LAB — CBC
HEMATOCRIT: 36.5 % (ref 36.0–46.0)
HEMOGLOBIN: 12.4 g/dL (ref 12.0–15.0)
MCH: 30.8 pg (ref 26.0–34.0)
MCHC: 34 g/dL (ref 30.0–36.0)
MCV: 90.6 fL (ref 78.0–100.0)
Platelets: 144 10*3/uL — ABNORMAL LOW (ref 150–400)
RBC: 4.03 MIL/uL (ref 3.87–5.11)
RDW: 13 % (ref 11.5–15.5)
WBC: 8.1 10*3/uL (ref 4.0–10.5)

## 2014-12-13 LAB — BASIC METABOLIC PANEL
Anion gap: 9 (ref 5–15)
BUN: 16 mg/dL (ref 6–20)
CALCIUM: 8.2 mg/dL — AB (ref 8.9–10.3)
CHLORIDE: 106 mmol/L (ref 101–111)
CO2: 24 mmol/L (ref 22–32)
CREATININE: 0.9 mg/dL (ref 0.44–1.00)
GFR calc Af Amer: >60 mL/min (ref >60)
GFR calc non Af Amer: >60 mL/min (ref >60)
GLUCOSE: 133 mg/dL — AB (ref 65–99)
Potassium: 3.2 mmol/L — ABNORMAL LOW (ref 3.5–5.1)
Sodium: 139 mmol/L (ref 135–145)

## 2014-12-13 LAB — COMPREHENSIVE METABOLIC PANEL
ALBUMIN: 3.1 g/dL — AB (ref 3.5–5.0)
ALK PHOS: 51 U/L (ref 38–126)
ALT: 22 U/L (ref 14–54)
ANION GAP: 7 (ref 5–15)
AST: 23 U/L (ref 15–41)
BUN: 17 mg/dL (ref 6–20)
CALCIUM: 8.5 mg/dL — AB (ref 8.9–10.3)
CO2: 24 mmol/L (ref 22–32)
Chloride: 108 mmol/L (ref 101–111)
Creatinine, Ser: 0.81 mg/dL (ref 0.44–1.00)
GFR calc Af Amer: >60 mL/min (ref >60)
GFR calc non Af Amer: >60 mL/min (ref >60)
GLUCOSE: 105 mg/dL — AB (ref 65–99)
POTASSIUM: 4.3 mmol/L (ref 3.5–5.1)
SODIUM: 139 mmol/L (ref 135–145)
Total Bilirubin: 0.5 mg/dL (ref 0.3–1.2)
Total Protein: 5.7 g/dL — ABNORMAL LOW (ref 6.5–8.1)

## 2014-12-13 LAB — URINE CULTURE: Culture: NO GROWTH

## 2014-12-13 LAB — BLOOD GAS, ARTERIAL
ACID-BASE DEFICIT: 0.3 mmol/L (ref 0.0–2.0)
Bicarbonate: 23.9 mEq/L (ref 20.0–24.0)
DRAWN BY: 42624
FIO2: 0.4
MECHVT: 420 mL
O2 SAT: 95.9 %
PATIENT TEMPERATURE: 98.6
PCO2 ART: 39.1 mmHg (ref 35.0–45.0)
PEEP/CPAP: 5 cmH2O
PH ART: 7.402 (ref 7.350–7.450)
RATE: 14 resp/min
TCO2: 25 mmol/L (ref 0–100)
pO2, Arterial: 80 mmHg (ref 80.0–100.0)

## 2014-12-13 LAB — MAGNESIUM: Magnesium: 1.8 mg/dL (ref 1.7–2.4)

## 2014-12-13 LAB — PHOSPHORUS: PHOSPHORUS: 3 mg/dL (ref 2.5–4.6)

## 2014-12-13 MED ORDER — VANCOMYCIN HCL IN DEXTROSE 1-5 GM/200ML-% IV SOLN
1000.0000 mg | Freq: Once | INTRAVENOUS | Status: AC
  Administered 2014-12-13: 1000 mg via INTRAVENOUS
  Filled 2014-12-13: qty 200

## 2014-12-13 MED ORDER — LORAZEPAM 2 MG/ML IJ SOLN
1.0000 mg | INTRAMUSCULAR | Status: DC | PRN
  Administered 2014-12-13 – 2014-12-16 (×5): 2 mg via INTRAVENOUS
  Filled 2014-12-13 (×5): qty 1

## 2014-12-13 MED ORDER — LEVALBUTEROL HCL 0.63 MG/3ML IN NEBU
0.6300 mg | INHALATION_SOLUTION | Freq: Four times a day (QID) | RESPIRATORY_TRACT | Status: DC
  Administered 2014-12-13 – 2014-12-19 (×25): 0.63 mg via RESPIRATORY_TRACT
  Filled 2014-12-13 (×27): qty 3

## 2014-12-13 MED ORDER — IOHEXOL 350 MG/ML SOLN
100.0000 mL | Freq: Once | INTRAVENOUS | Status: AC | PRN
  Administered 2014-12-13: 80 mL via INTRAVENOUS

## 2014-12-13 MED ORDER — POTASSIUM CHLORIDE 10 MEQ/50ML IV SOLN
10.0000 meq | INTRAVENOUS | Status: AC
  Administered 2014-12-13 (×6): 10 meq via INTRAVENOUS
  Filled 2014-12-13 (×6): qty 50

## 2014-12-13 MED ORDER — METOPROLOL TARTRATE 1 MG/ML IV SOLN
2.5000 mg | Freq: Once | INTRAVENOUS | Status: AC
  Administered 2014-12-13: 2.5 mg via INTRAVENOUS
  Filled 2014-12-13: qty 5

## 2014-12-13 MED ORDER — VANCOMYCIN HCL IN DEXTROSE 750-5 MG/150ML-% IV SOLN
750.0000 mg | Freq: Two times a day (BID) | INTRAVENOUS | Status: DC
  Administered 2014-12-14 – 2014-12-16 (×5): 750 mg via INTRAVENOUS
  Filled 2014-12-13 (×8): qty 150

## 2014-12-13 NOTE — Progress Notes (Addendum)
Patient's heart rate sustained in the 120's. Propofol maxed. Increased Fentanyl. No change. Jamison Neighbor, MD notified. New orders for Ativan PRN.  UPDATE 1300: Pt's heart rate unchanged after Ativan. HR:120 BP:126/78 (88). Notified Nestor, MD. Orders for Lopressor (once).

## 2014-12-13 NOTE — Progress Notes (Signed)
Endotracheal tube found to be in right main stem via CT. Dr. Brandy Hale aware. RT made aware.

## 2014-12-13 NOTE — Progress Notes (Signed)
*  PRELIMINARY RESULTS* Echocardiogram 2D Echocardiogram has been performed.  [name] 12/13/2014, 3:43 PM

## 2014-12-13 NOTE — Consult Note (Signed)
NEURO HOSPITALIST CONSULT NOTE   Referring physician: PCCM   Reason for Consult: Prognostication after CPR  HPI:                                                                                                                                          [name] is an 54 y.o. female is a 54 y/o woman who  has a past medical history of Hypertension; High cholesterol; Diabetes mellitus; Coronary artery disease; Heart murmur; CHF (congestive heart failure) (2010); Bipolar 1 disorder (11/26/2011); and Anxiety. Per report she called EMS with the complain of shortness of breath. When they arrived they found her pulseless. She underwent about 6 minutes of CPR prior to ROSC. There is question if she may have been down greater than 6 minutes as she was found pulseless by EMS. She was brought back to normothermic at 0200 hours 9.6.2016. EEG was obtained however she was under influence of Propofol and Fentanyl along with still under hypothermic protocol.   Past Medical History  Diagnosis Date  . Hypertension   . High cholesterol   . Diabetes mellitus   . Coronary artery disease   . Heart murmur   . CHF (congestive heart failure) 2010  . Bipolar 1 disorder 11/26/2011    "not on medicine"  . Anxiety     Past Surgical History  Procedure Laterality Date  . Tubal ligation    . Cardiac catheterization  ~ 2011; 11/26/2011  . Left heart catheterization with coronary angiogram N/A 11/26/2011    Procedure: LEFT HEART CATHETERIZATION WITH CORONARY ANGIOGRAM;  Surgeon: Pamella Pert, MD;  Location[facility] CATH LAB;  Service: Cardiovascular;  Laterality: N/A;  . Left heart catheterization with coronary angiogram N/A 09/21/2013    Procedure: LEFT HEART CATHETERIZATION WITH CORONARY ANGIOGRAM;  Surgeon: Pamella Pert, MD;  Location: [facility] CATH LAB;  Service: Cardiovascular;  Laterality: N/A;    Family History  Problem Relation Age of Onset  . Hypertension Mother   . Hypertension  Father      Social History:  reports that she has been smoking Cigarettes.  She has been smoking about 0.50 packs per day. She has never used smokeless tobacco. She reports that she does not drink alcohol or use illicit drugs.  Allergies  Allergen Reactions  . Hydrocodone Itching  . Erythromycin Rash    MEDICATIONS:  Prior to Admission:  Prescriptions prior to admission  Medication Sig Dispense Refill Last Dose  . albuterol (PROAIR HFA) 108 (90 BASE) MCG/ACT inhaler Inhale 2 puffs into the lungs every 6 (six) hours as needed. wheezing   rescue at rescue  . ALPRAZolam (XANAX) 0.25 MG tablet Take 0.25 mg by mouth 3 (three) times daily.     Marland Kitchen lisinopril-hydrochlorothiazide (ZESTORETIC) 20-12.5 MG per tablet Take 2 tablets by mouth daily.     . pravastatin (PRAVACHOL) 40 MG tablet Take 40 mg by mouth daily.     Marland Kitchen aspirin EC 81 MG tablet Take 81 mg by mouth daily.   09/19/2013 at Unknown time  . atorvastatin (LIPITOR) 20 MG tablet Take 20 mg by mouth daily.   09/19/2013 at Unknown time  . lisinopril (PRINIVIL,ZESTRIL) 20 MG tablet Take 20 mg by mouth daily.     . metFORMIN (GLUCOPHAGE) 500 MG tablet Take 500 mg by mouth at bedtime.     . metoprolol succinate (TOPROL-XL) 25 MG 24 hr tablet Take 25 mg by mouth daily.   09/19/2013 at Unknown time   Scheduled: . ampicillin-sulbactam (UNASYN) IV  3 g Intravenous Q6H  . antiseptic oral rinse  7 mL Mouth Rinse 10 times per day  . chlorhexidine gluconate  15 mL Mouth Rinse BID  . famotidine (PEPCID) IV  20 mg Intravenous Q12H  . feeding supplement (VITAL HIGH PROTEIN)  1,000 mL Per Tube Q24H  . heparin  5,000 Units Subcutaneous 3 times per day  . insulin aspart  2-6 Units Subcutaneous 6 times per day  . potassium chloride  10 mEq Intravenous Q1 Hr x 6     ROS:                                                                                                                                        History obtained from unobtainable from patient due to mental status    Blood pressure 188/71, pulse 117, temperature 99.7 F (37.6 C), temperature source Core (Comment), resp. rate 14, height 5\' 2"  (1.575 m), weight 60.4 kg (133 lb 2.5 oz), last menstrual period 06/26/2011, SpO2 97 %.   Neurologic Examination:                                                                                                      HEENT-  Normocephalic, no lesions, without obvious abnormality.  Normal external eye and conjunctiva.  Normal TM's bilaterally.  Normal auditory canals and external  ears. Normal external nose, mucus membranes and septum.  Normal pharynx. Cardiovascular- S1, S2 normal, pulses palpable throughout   Lungs- chest clear, no wheezing, rales, normal symmetric air entry Abdomen- normal findings: bowel sounds normal Extremities- no edema Lymph-no adenopathy palpable Musculoskeletal-no joint tenderness, deformity or swelling Skin-warm and dry, no hyperpigmentation, vitiligo, or suspicious lesions  Neurological Examination Mental Status: Patient does not respond to verbal stimuli.  Does not respond to deep sternal rub.  Does not follow commands.  No verbalizations noted.  Cranial Nerves: II: patient does not respond confrontation bilaterally, pupils right 2 mm, left 2 mm,and reactive bilaterally III,IV,VI: doll's response absent bilaterally.  V,VII: corneal reflex present on the left but absent on the right VIII: patient does not respond to verbal stimuli IX,X: gag reflex present, XI: trapezius strength unable to test bilaterally XII: tongue strength unable to test Motor: Extremities flaccid throughout.  No spontaneous movement noted.  No purposeful movements noted. Sensory: Does not respond to noxious stimuli in any extremity. Deep Tendon Reflexes:  2+ bilateral UE Plantars: absent bilaterally Cerebellar: Unable to  perform     Lab Results: Basic Metabolic Panel:  Recent Labs Lab 12/31/2014 0045 12/09/2014 0411 01/02/2015 1505 12/13/14 0317  NA 138 140 141 139  K 4.2 4.6 3.9 3.2*  CL 107 104 108 106  CO2 20* 23 24 24   GLUCOSE 347* 175* 83 133*  BUN 13 13 15 16   CREATININE 1.34* 1.30* 0.97 0.90  CALCIUM 8.3* 8.6* 8.4* 8.2*  MG  --   --   --  1.8  PHOS  --   --   --  3.0    Liver Function Tests: No results for input(s): AST, ALT, ALKPHOS, BILITOT, PROT, ALBUMIN in the last 168 hours. No results for input(s): LIPASE, AMYLASE in the last 168 hours. No results for input(s): AMMONIA in the last 168 hours.  CBC:  Recent Labs Lab 12/10/2014 0045 12/17/2014 0411 12/13/14 0317  WBC 14.9* 13.6* 8.1  HGB 14.4 15.0 12.4  HCT 42.5 44.0 36.5  MCV 94.0 92.8 90.6  PLT 258 160 144*    Cardiac Enzymes:  Recent Labs Lab 12/15/2014 0411 12/16/2014 0845 01/05/2015 1505 12/10/2014 2140  TROPONINI 0.17* 0.18* 0.17* 0.18*    Lipid Panel: No results for input(s): CHOL, TRIG, HDL, CHOLHDL, VLDL, LDLCALC in the last 168 hours.  CBG:  Recent Labs Lab 12/11/2014 1506 01/06/2015 2010 12/19/2014 2324 12/13/14 0306 12/13/14 0813  GLUCAP 74 96 123* 129* 74    Microbiology: Results for orders placed or performed during the hospital encounter of 12/11/2014  MRSA PCR Screening     Status: None   Collection Time: 01/03/2015  2:57 AM  Result Value Ref Range Status   MRSA by PCR NEGATIVE NEGATIVE Final    Comment:        The GeneXpert MRSA Assay (FDA approved for NASAL specimens only), is one component of a comprehensive MRSA colonization surveillance program. It is not intended to diagnose MRSA infection nor to guide or monitor treatment for MRSA infections.   Urine culture     Status: None   Collection Time: 12/14/2014 11:44 AM  Result Value Ref Range Status   Specimen Description URINE, CATHETERIZED  Final   Special Requests NONE  Final   Culture NO GROWTH 1 DAY  Final   Report Status 12/13/2014 FINAL   Final  Culture, respiratory (NON-Expectorated)     Status: None (Preliminary result)   Collection Time: 12/17/2014 12:10 PM  Result Value Ref  Range Status   Specimen Description TRACHEAL ASPIRATE  Final   Special Requests Normal  Final   Gram Stain   Final    RARE WBC PRESENT,BOTH PMN AND MONONUCLEAR NO SQUAMOUS EPITHELIAL CELLS SEEN NO ORGANISMS SEEN Performed at [facility]    Culture   Final    Culture reincubated for better growth Performed at [facility]    Report Status PENDING  Incomplete    Coagulation Studies:  Recent Labs  2015-01-08 0045 01/08/15 0411  LABPROT 13.7 14.2  INR 1.03 1.08    Imaging: Dg Chest Port 1 View  12/13/2014   CLINICAL DATA:  Shortness of breath.  EXAM: PORTABLE CHEST - 1 VIEW  COMPARISON:  01-08-2015.  FINDINGS: Endotracheal tube noted with its tip 1 cm above the carina. Proximal repositioning of approximately 1- 2 cm suggested. NG tube and left subclavian central line in stable position. Low lung volumes with bibasilar atelectasis and/or infiltrates. Infiltrate left upper lobe cannot be excluded. Small left pleural effusion. No pneumothorax. Stable mild cardiomegaly. No prominent pulmonary venous congestion. No acute bony abnormality.  IMPRESSION: 1. Endotracheal tube tip 1 cm above the carina. Proximal repositioning of approximately 1 -2 cm suggested. 2. NG tube and left subclavian central line in stable position. 3. Low lung volumes with bibasilar atelectasis and/or infiltrates. Left upper lobe infiltrate cannot be excluded. Small left pleural effusion.   Electronically Signed   By: Maisie Fus  Register   On: 12/13/2014 07:09   Dg Chest Port 1 View  2015-01-08   CLINICAL DATA:  Central line placement.  EXAM: PORTABLE CHEST - 1 VIEW  COMPARISON:  01-08-15 prior exams  FINDINGS: Endotracheal tube is identified with tip difficult to visualize but appears to lie 2 cm above the carina.  An NG tube is identified entering the stomach with tip  off the field of view.  A left subclavian central venous catheter is now noted with tip overlying the superior cavoatrial junction.  Defibrillator pads overlying the left chest are again noted with pulmonary vascular congestion and possible mild interstitial edema.  There is no evidence of pneumothorax.  Bibasilar atelectasis again noted.  IMPRESSION: Left subclavian central venous catheter with tip overlying the superior cavoatrial junction -no evidence of pneumothorax.  Otherwise unchanged appearance of the chest.   Electronically Signed   By: Harmon Pier M.D.   On: 08-Jan-2015 15:30   Dg Chest Port 1 View  08-Jan-2015   CLINICAL DATA:  Intubation and OG tube.  Shortness of breath.  EXAM: PORTABLE CHEST - 1 VIEW  COMPARISON:  01/08/15  FINDINGS: Endotracheal tube tip measures 2.1 cm above the carina. Enteric tube tip is off the field of view but below the left hemidiaphragm. Multiple EKG wires. Mild cardiac enlargement with mild pulmonary vascular congestion and probable perihilar edema. No blunting of costophrenic angles. No pneumothorax. Mediastinal contours appear intact.  IMPRESSION: Appliances appear in satisfactory position. Mild cardiac enlargement with pulmonary vascular congestion and edema.   Electronically Signed   By: Burman Nieves M.D.   On: 01-08-2015 03:49   Dg Chest Port 1 View  01/08/2015   CLINICAL DATA:  Difficulty breathing. Episode of unresponsiveness. Apnea and asystole. Shortness of breath.  EXAM: PORTABLE CHEST - 1 VIEW  COMPARISON:  05/26/2013  FINDINGS: Backboard and equipment artifact. Endotracheal tube placed with tip measuring 3.4 cm above the carina. Enteric tube is present. Tip is off the field of view but is below the left hemidiaphragm. There is focal infiltration  or atelectasis in the right lung base. Left lung appears grossly clear. Mild cardiac enlargement and pulmonary vascular congestion. No pneumothorax. No blunting of costophrenic angles.  IMPRESSION: Appliances  appear to be in satisfactory position. Cardiac enlargement with mild vascular congestion. Focal atelectasis or consolidation in the right lung base.   Electronically Signed   By: Burman Nieves M.D.   On: 12/10/2014 02:23       Assessment and plan per attending neurologist  Felicie Morn PA-C [facility] [phone]  12/13/2014, 11:11 AM   Assessment/Plan: 54 YO female S/P cardiac arrest found pulseless by EMS.  Possible down time 6 minute or greater. She is only 9 hours post rewarming. Current exam shows intact pupillary response, intact left corneal, intact gag.  EEG obtained demonstrated profound generalized cerebral dysfunction as can be seen in medication induced coma(e.g. with propofol) as well as with severe anoxic brain injury.    Recommend: 1) repeat EEG when off sedation 2) CT head 3) continue to follow after she is rewarmed for 72 hours.  Until then no clear prognostication can be made.     Elspeth Cho, DO [facility] [phone]  If 7pm- 7am, please page neurology on call as listed in AMION.

## 2014-12-13 NOTE — Progress Notes (Signed)
ANTIBIOTIC CONSULT NOTE - INITIAL  Pharmacy Consult for Vancomycin Indication: bacteremia  Allergies  Allergen Reactions  . Hydrocodone Itching  . Erythromycin Rash    Patient Measurements: Height:  (157.5 cm) Weight: 133 lb 2.5 oz (60.4 kg) IBW/kg (Calculated) : 50.1  Vital Signs: Temp: 97.7 F (36.5 C) (09/06 1400) Temp Source: Core (Comment) (09/06 1400) BP: 78/35 mmHg (09/06 1400) Pulse Rate: 89 (09/06 1400) Intake/Output from previous day: 09/05 0701 - 09/06 0700 In: 1383 [I.V.:470; NG/GT:360; IV Piggyback:550] Out: 1370 [Urine:1370] Intake/Output from this shift: Total I/O In: 1059.3 [I.V.:409.3; NG/GT:350; IV Piggyback:300] Out: 165 [Urine:165]  Labs:  Recent Labs  12/23/14 0045 2014/12/23 0411 Dec 23, 2014 1505 12/13/14 0317 12/13/14 1120  WBC 14.9* 13.6*  --  8.1  --   HGB 14.4 15.0  --  12.4  --   PLT 258 160  --  144*  --   CREATININE 1.34* 1.30* 0.97 0.90 0.81   Estimated Creatinine Clearance: 67.1 mL/min (by C-G formula based on Cr of 0.81).  Microbiology: Recent Results (from the past 720 hour(s))  MRSA PCR Screening     Status: None   Collection Time: 12/23/2014  2:57 AM  Result Value Ref Range Status   MRSA by PCR NEGATIVE NEGATIVE Final    Comment:        The GeneXpert MRSA Assay (FDA approved for NASAL specimens only), is one component of a comprehensive MRSA colonization surveillance program. It is not intended to diagnose MRSA infection nor to guide or monitor treatment for MRSA infections.   Urine culture     Status: None   Collection Time: 12-23-2014 11:44 AM  Result Value Ref Range Status   Specimen Description URINE, CATHETERIZED  Final   Special Requests NONE  Final   Culture NO GROWTH 1 DAY  Final   Report Status 12/13/2014 FINAL  Final  Culture, respiratory (NON-Expectorated)     Status: None (Preliminary result)   Collection Time: December 23, 2014 12:10 PM  Result Value Ref Range Status   Specimen Description TRACHEAL ASPIRATE   Final   Special Requests Normal  Final   Gram Stain   Final    RARE WBC PRESENT,BOTH PMN AND MONONUCLEAR NO SQUAMOUS EPITHELIAL CELLS SEEN NO ORGANISMS SEEN Performed at [facility]    Culture   Final    Culture reincubated for better growth Performed at [facility]    Report Status PENDING  Incomplete  Culture, blood (routine x 2)     Status: None (Preliminary result)   Collection Time: 12-23-14  1:15 PM  Result Value Ref Range Status   Specimen Description BLOOD LEFT HAND  Final   Special Requests BOTTLES DRAWN AEROBIC ONLY 1.5CC  Final   Culture  Setup Time   Final    GRAM POSITIVE RODS AEROBIC BOTTLE ONLY CRITICAL RESULT CALLED TO, READ BACK BY AND VERIFIED WITH: P DAVIS,RN AT 1343 12/13/14 BY L BENFIELD    Culture GRAM POSITIVE RODS  Final   Report Status PENDING  Incomplete    Medical History: Past Medical History  Diagnosis Date  . Hypertension   . High cholesterol   . Diabetes mellitus   . Coronary artery disease   . Heart murmur   . CHF (congestive heart failure) 2010  . Bipolar 1 disorder 11/26/2011    "not on medicine"  . Anxiety    Assessment: 53yof s/p cardiac arrest on hypothermia on unasyn for possible aspiration pneumonia now with positive blood cultures. She will begin  vancomycin. Renal function improved since admission.  9/5 unasyn>> 9/6 vancomycin>>  9/5 urine: negative, final 9/5 blood x2: 1/2 gram positive rods 9/5 resp: re-incubated  Goal of Therapy:  Vancomycin trough level 15-20 mcg/ml  Plan:  1) Vancomycin 1g IV x 1 then 750mg  IV q12 2) Follow renal function, cultures, LOT, level if needed  Fredrik Rigger 12/13/2014,2:40 PM

## 2014-12-13 NOTE — Progress Notes (Signed)
PULMONARY / CRITICAL CARE MEDICINE   Name: [name] MRN: [id number] DOB: August 06, 1960    ADMISSION DATE:  2014-12-26 CONSULTATION DATE:  12-26-14  REFERRING MD :  ED  CHIEF COMPLAINT:  Asystolic Arrest  INITIAL PRESENTATION: Out of hospital arrest. Patient called EMS w/ dyspnea. Found pulseless on their arrival. 6 minutes of CPR for ROSC. Suspected aspiration in transit to ED because of [facility] airway.  STUDIES:  EEG 9/5 - burst suppression but also on Propofol. pCXR 9/6 - progressive LLL opacity & persitent RML opacity.  SIGNIFICANT EVENTS: 9/5 - Intubated in ED 9/5 - Cooled w/ normothermia protocol  SUBJECTIVE: Patient remains on cooling protocol. Intermittent periods of tachycardia & hypertension. Son reports patient may have responded to his voice earlier.  ROS:  Unobtainable as patient is intubated & sedated.  VITAL SIGNS: Temp:  [95.7 F (35.4 C)-99.7 F (37.6 C)] 99.7 F (37.6 C) (09/06 1000) Pulse Rate:  [72-119] 117 (09/06 1000) Resp:  [0-29] 14 (09/06 1000) BP: (92-225)/(46-121) 188/71 mmHg (09/06 1000) SpO2:  [96 %-100 %] 97 % (09/06 1000) Arterial Line BP: (91-226)/(47-102) 158/77 mmHg (09/06 1000) FiO2 (%):  [40 %] 40 % (09/06 0835) Weight:  [60.4 kg (133 lb 2.5 oz)] 60.4 kg (133 lb 2.5 oz) (09/06 0500) HEMODYNAMICS:   VENTILATOR SETTINGS: Vent Mode:  [-] PRVC FiO2 (%):  [40 %] 40 % Set Rate:  [14 bmp] 14 bmp Vt Set:  [420 mL] 420 mL PEEP:  [5 cmH20] 5 cmH20 Plateau Pressure:  [19 cmH20-26 cmH20] 21 cmH20 INTAKE / OUTPUT:  Intake/Output Summary (Last 24 hours) at 12/13/14 1119 Last data filed at 12/13/14 1100  Gross per 24 hour  Intake 1795.99 ml  Output    715 ml  Net 1080.99 ml    PHYSICAL EXAMINATION: General:  Sedated. No acute distress. Son & daughter at bedside.  Integument:  Warm & dry. No rash on exposed skin.  HEENT:  No scleral injection or icterus. Endotracheal tube in place.  Cardiovascular:  Regular rhythm but tachycardic. No  edema. No appreciable JVD.  Pulmonary:  Good aeration bilaterally on ventilator. Symmetric chest wall expansion.  Abdomen: Soft. Normal bowel sounds. Nondistended. Neurological:  PERRL. Doesn't withdraw to pain on sedation.  LABS:  CBC  Recent Labs Lab 12/26/14 0045 12/26/14 0411 12/13/14 0317  WBC 14.9* 13.6* 8.1  HGB 14.4 15.0 12.4  HCT 42.5 44.0 36.5  PLT 258 160 144*   Coag's  Recent Labs Lab 26-Dec-2014 0045 2014/12/26 0411  APTT 28 26  INR 1.03 1.08   BMET  Recent Labs Lab 12-26-2014 0411 12/26/14 1505 12/13/14 0317  NA 140 141 139  K 4.6 3.9 3.2*  CL 104 108 106  CO2 23 24 24   BUN 13 15 16   CREATININE 1.30* 0.97 0.90  GLUCOSE 175* 83 133*   Electrolytes  Recent Labs Lab 26-Dec-2014 0411 Dec 26, 2014 1505 12/13/14 0317  CALCIUM 8.6* 8.4* 8.2*  MG  --   --  1.8  PHOS  --   --  3.0   Sepsis Markers  Recent Labs Lab 12-26-2014 0044 Dec 26, 2014 0110  LATICACIDVEN 5.0* 5.05*   ABG  Recent Labs Lab 12-26-2014 0115 2014/12/26 0446 12/13/14 0400  PHART 7.195* 7.310* 7.402  PCO2ART 54.8* 49.5* 39.1  PO2ART 172.0* 363.0* 80.0   Liver Enzymes No results for input(s): AST, ALT, ALKPHOS, BILITOT, ALBUMIN in the last 168 hours. Cardiac Enzymes  Recent Labs Lab 12/26/2014 0845 Dec 26, 2014 1505 26-Dec-2014 2140  TROPONINI 0.18* 0.17* 0.18*   Glucose  Recent Labs Lab 12-22-2014 1124 December 22, 2014 1506 12/22/14 2010 2014-12-22 2324 12/13/14 0306 12/13/14 0813  GLUCAP 121* 74 96 123* 129* 74    Imaging Dg Chest Port 1 View  12/13/2014   CLINICAL DATA:  Shortness of breath.  EXAM: PORTABLE CHEST - 1 VIEW  COMPARISON:  12-22-14.  FINDINGS: Endotracheal tube noted with its tip 1 cm above the carina. Proximal repositioning of approximately 1- 2 cm suggested. NG tube and left subclavian central line in stable position. Low lung volumes with bibasilar atelectasis and/or infiltrates. Infiltrate left upper lobe cannot be excluded. Small left pleural effusion. No pneumothorax.  Stable mild cardiomegaly. No prominent pulmonary venous congestion. No acute bony abnormality.  IMPRESSION: 1. Endotracheal tube tip 1 cm above the carina. Proximal repositioning of approximately 1 -2 cm suggested. 2. NG tube and left subclavian central line in stable position. 3. Low lung volumes with bibasilar atelectasis and/or infiltrates. Left upper lobe infiltrate cannot be excluded. Small left pleural effusion.   Electronically Signed   By: Maisie Fus  Register   On: 12/13/2014 07:09   Dg Chest Port 1 View  12-22-2014   CLINICAL DATA:  Central line placement.  EXAM: PORTABLE CHEST - 1 VIEW  COMPARISON:  December 22, 2014 prior exams  FINDINGS: Endotracheal tube is identified with tip difficult to visualize but appears to lie 2 cm above the carina.  An NG tube is identified entering the stomach with tip off the field of view.  A left subclavian central venous catheter is now noted with tip overlying the superior cavoatrial junction.  Defibrillator pads overlying the left chest are again noted with pulmonary vascular congestion and possible mild interstitial edema.  There is no evidence of pneumothorax.  Bibasilar atelectasis again noted.  IMPRESSION: Left subclavian central venous catheter with tip overlying the superior cavoatrial junction -no evidence of pneumothorax.  Otherwise unchanged appearance of the chest.   Electronically Signed   By: Harmon Pier M.D.   On: 22-Dec-2014 15:30     ASSESSMENT / PLAN:  PULMONARY OETT 7.5 9/5>> A: Acute Hypoxic Respiratory Failure Respiratory Arrest Probable aspiration H/O COPD Ongoing tobacco use  P:   CTA chest to rule out PE Xopenex nebs every 6 hours Vent bundle Wean FiO2 for Sat >94% See ID for aspiration  CARDIOVASCULAR L [place] CVL 9/5>> A:  Asystolic Arrest Hypertension  P:  Cardiology consulted Continue cooling protocol TTE ordered Labetalol IV prn  RENAL A:   Hypokalemia - replacing IV  P:   Monitor electrolytes daily Monitor renal  function w/ daily BUN/Creatinine Monitor UOP with foley catheter  GASTROINTESTINAL A:   No acute issues  P:   TF per dietary recommendations Pepcid IV q12hr  HEMATOLOGIC A:   No acute issues.  P:  SCDs Heparin [place] q8hr Monitor Hgb daily w/ CBC  INFECTIOUS A:   Aspiration pneumonitis vs pneumonia  P:   Monitor for leukocytosis with daily CBC  BCx2 9/5>> UC 9/5>> Sputum 9/5>>  Abx:  Unasyn, start date 9/5, day 1/x  ENDOCRINE A:   H/O DM    P:   Accuchecks q4hr SSI coverage per algorithm  NEUROLOGIC A:   Possible Anoxic Brain Injury Burst Suppression on EEG  P:   RASS goal: 0 Propofol gtt Fentanyl gtt Ativan IV prn Plan to repeat EEG off Propofol Neuro consulted CT head pending   FAMILY  - Updates: Son & daughter updated at beside by both me & Neurology today.  - Inter-disciplinary family meet or Palliative Care meeting  due by:  9/12   TODAY'S SUMMARY: 54 year old female with reported history of COPD & ongoing tobacco use. Patient contacted EMS for dyspnea and on arrival was found to be in asystole. The etiology for this acute decompensation is unclear at this time. Cultures have been unrevealing. Patient does have questionable anoxic brain injury, but this EEG was performed on propofol. She does not have a profound oxygen requirement but is having periods of tachycardia. I feel that evaluation with an echocardiogram and CT angiogram of the chest is reasonable at this time. I am continuing coverage for aspiration pneumonia versus pneumonitis.  I have spent a total of 37 minutes of critical care time today caring for the patient, reviewing the patient's electronic medical record, discussing the plan of care with neurology, & updating the patient's family at bedside.  Donna Christen Jamison Neighbor, M.D. [facility] Pulmonary & Critical Care Pager:  [phone] After 3pm or if no response, call [phone]  12/13/2014, 11:19 AM

## 2014-12-13 NOTE — Progress Notes (Signed)
RT note: Pt. Transported from 2H05 uneventfully to/from CT, RT to monitor.

## 2014-12-13 NOTE — Progress Notes (Signed)
CRITICAL VALUE ALERT  Critical value received:  Aerobic gram positive rods in blood  Date of notification:  12/13/2014   Time of notification:  1343  Critical value read back:Yes.    Nurse who received alert:  Kenta Laster Nicoletta Dress   MD notified (1st Dejour Vos):  Jamison Neighbor, MD  Time of first Kanchan Gal:  1355   Responding MD:  Jamison Neighbor, MD  Time MD responded:  1418, MD aware. New orders given for Vancomycin

## 2014-12-13 NOTE — Progress Notes (Signed)
eLink Physician-Brief Progress Note Patient Name: [name] DOB: 06-17-1960 MRN: [id number]   Date of Service  12/13/2014  HPI/Events of Note  Hypokalemia  eICU Interventions  Potassium replaced     Intervention Category Intermediate Interventions: Electrolyte abnormality - evaluation and management  DETERDING,ELIZABETH 12/13/2014, 4:09 AM

## 2014-12-14 ENCOUNTER — Inpatient Hospital Stay (HOSPITAL_COMMUNITY): Payer: Medicare HMO

## 2014-12-14 DIAGNOSIS — T17500A Unspecified foreign body in bronchus causing asphyxiation, initial encounter: Secondary | ICD-10-CM

## 2014-12-14 DIAGNOSIS — G936 Cerebral edema: Secondary | ICD-10-CM | POA: Insufficient documentation

## 2014-12-14 DIAGNOSIS — J9809 Other diseases of bronchus, not elsewhere classified: Secondary | ICD-10-CM

## 2014-12-14 LAB — CULTURE, RESPIRATORY

## 2014-12-14 LAB — POCT I-STAT 3, ART BLOOD GAS (G3+)
ACID-BASE DEFICIT: 4 mmol/L — AB (ref 0.0–2.0)
Acid-base deficit: 3 mmol/L — ABNORMAL HIGH (ref 0.0–2.0)
BICARBONATE: 23 meq/L (ref 20.0–24.0)
Bicarbonate: 25.3 mEq/L — ABNORMAL HIGH (ref 20.0–24.0)
O2 Saturation: 100 %
O2 Saturation: 87 %
PCO2 ART: 62.7 mmHg — AB (ref 35.0–45.0)
PH ART: 7.214 — AB (ref 7.350–7.450)
PH ART: 7.269 — AB (ref 7.350–7.450)
PO2 ART: 406 mmHg — AB (ref 80.0–100.0)
PO2 ART: 57 mmHg — AB (ref 80.0–100.0)
Patient temperature: 37.1
Patient temperature: 35.7
TCO2: 27 mmol/L (ref 0–100)
TCO2: 25 mmol/L (ref 0–100)
pCO2 arterial: 49.4 mmHg — ABNORMAL HIGH (ref 35.0–45.0)

## 2014-12-14 LAB — PHOSPHORUS: Phosphorus: 3.5 mg/dL (ref 2.5–4.6)

## 2014-12-14 LAB — COMPREHENSIVE METABOLIC PANEL
ALBUMIN: 2.7 g/dL — AB (ref 3.5–5.0)
ALT: 19 U/L (ref 14–54)
ANION GAP: 7 (ref 5–15)
AST: 29 U/L (ref 15–41)
Alkaline Phosphatase: 48 U/L (ref 38–126)
BILIRUBIN TOTAL: 0.4 mg/dL (ref 0.3–1.2)
BUN: 14 mg/dL (ref 6–20)
CHLORIDE: 102 mmol/L (ref 101–111)
CO2: 26 mmol/L (ref 22–32)
Calcium: 8.4 mg/dL — ABNORMAL LOW (ref 8.9–10.3)
Creatinine, Ser: 0.69 mg/dL (ref 0.44–1.00)
GFR calc Af Amer: >60 mL/min (ref >60)
GFR calc non Af Amer: >60 mL/min (ref >60)
GLUCOSE: 99 mg/dL (ref 65–99)
POTASSIUM: 4.4 mmol/L (ref 3.5–5.1)
SODIUM: 135 mmol/L (ref 135–145)
TOTAL PROTEIN: 5.4 g/dL — AB (ref 6.5–8.1)

## 2014-12-14 LAB — GLUCOSE, CAPILLARY
GLUCOSE-CAPILLARY: 115 mg/dL — AB (ref 65–99)
GLUCOSE-CAPILLARY: 129 mg/dL — AB (ref 65–99)
GLUCOSE-CAPILLARY: 95 mg/dL (ref 65–99)
Glucose-Capillary: 92 mg/dL (ref 65–99)
Glucose-Capillary: 134 mg/dL — ABNORMAL HIGH (ref 65–99)
Glucose-Capillary: 121 mg/dL — ABNORMAL HIGH (ref 65–99)

## 2014-12-14 LAB — MAGNESIUM: Magnesium: 1.8 mg/dL (ref 1.7–2.4)

## 2014-12-14 LAB — CULTURE, RESPIRATORY W GRAM STAIN: Special Requests: NORMAL

## 2014-12-14 LAB — CBC WITH DIFFERENTIAL/PLATELET
BASOS ABS: 0 10*3/uL (ref 0.0–0.1)
Basophils Relative: 0 % (ref 0–1)
Eosinophils Absolute: 0.2 10*3/uL (ref 0.0–0.7)
Eosinophils Relative: 2 % (ref 0–5)
HEMATOCRIT: 34.9 % — AB (ref 36.0–46.0)
Hemoglobin: 11.5 g/dL — ABNORMAL LOW (ref 12.0–15.0)
LYMPHS ABS: 1.2 10*3/uL (ref 0.7–4.0)
LYMPHS PCT: 15 % (ref 12–46)
MCH: 30.9 pg (ref 26.0–34.0)
MCHC: 33 g/dL (ref 30.0–36.0)
MCV: 93.8 fL (ref 78.0–100.0)
MONO ABS: 0.7 10*3/uL (ref 0.1–1.0)
Monocytes Relative: 10 % (ref 3–12)
NEUTROS ABS: 5.5 10*3/uL (ref 1.7–7.7)
Neutrophils Relative %: 73 % (ref 43–77)
Platelets: 137 10*3/uL — ABNORMAL LOW (ref 150–400)
RBC: 3.72 MIL/uL — AB (ref 3.87–5.11)
RDW: 13.3 % (ref 11.5–15.5)
WBC: 7.5 10*3/uL (ref 4.0–10.5)

## 2014-12-14 LAB — TROPONIN I: Troponin I: 2.4 ng/mL (ref <0.031)

## 2014-12-14 MED ORDER — LIDOCAINE HCL (PF) 1 % IJ SOLN
INTRAMUSCULAR | Status: AC
  Administered 2014-12-15: 5 mL
  Filled 2014-12-14: qty 5

## 2014-12-14 MED ORDER — DOCUSATE SODIUM 100 MG PO CAPS
100.0000 mg | ORAL_CAPSULE | Freq: Two times a day (BID) | ORAL | Status: DC
  Administered 2014-12-15 – 2014-12-16 (×4): 100 mg via ORAL
  Filled 2014-12-14 (×7): qty 1

## 2014-12-14 MED ORDER — LABETALOL HCL 5 MG/ML IV SOLN
10.0000 mg | INTRAVENOUS | Status: DC | PRN
  Administered 2014-12-14 – 2014-12-18 (×5): 10 mg via INTRAVENOUS
  Filled 2014-12-14 (×5): qty 4

## 2014-12-14 MED ORDER — ACETYLCYSTEINE 20 % IN SOLN
3.0000 mL | RESPIRATORY_TRACT | Status: DC
  Administered 2014-12-15 – 2014-12-16 (×3): 3 mL via RESPIRATORY_TRACT
  Filled 2014-12-14 (×9): qty 4

## 2014-12-14 MED ORDER — ACETYLCYSTEINE 10 % IN SOLN: 6.0000 mL | RESPIRATORY_TRACT | Status: DC

## 2014-12-14 MED ORDER — SODIUM CHLORIDE 0.9 % IV BOLUS (SEPSIS)
1000.0000 mL | Freq: Once | INTRAVENOUS | Status: AC
  Administered 2014-12-14: 1000 mL via INTRAVENOUS

## 2014-12-14 MED ORDER — SENNOSIDES 8.8 MG/5ML PO SYRP
5.0000 mL | ORAL_SOLUTION | Freq: Two times a day (BID) | ORAL | Status: DC
  Administered 2014-12-14 – 2014-12-18 (×9): 5 mL
  Filled 2014-12-14 (×11): qty 5

## 2014-12-14 NOTE — Progress Notes (Signed)
eLink Physician-Brief Progress Note Patient Name: [name] DOB: 1960/10/06 MRN: [id number]   Date of Service  12/14/2014  HPI/Events of Note  Hypotension - BP = 46/31 by A-line. Awaiting Norepinephrine IV infusion from pharmacy.  eICU Interventions  Will order:  1. 0.9 NaCl 1 liter IV over 1 hour now.      Intervention Category Major Interventions: Hypotension - evaluation and management  [name] 12/14/2014, 9:54 PM

## 2014-12-14 NOTE — Progress Notes (Signed)
eLink Physician-Brief Progress Note Patient Name: [name] DOB: 08-18-1960 MRN: [id number]   Date of Service  12/14/2014  HPI/Events of Note  CXR with L PTX and L Pneumomediastinum. Trop=2.4  eICU Interventions  CCM physician informed, and will plan to place Chest tube. RN informed of plan.  Trend CE     Intervention Category Major Interventions: Other:  Treshun Wold 12/14/2014, 11:24 PM

## 2014-12-14 NOTE — Progress Notes (Signed)
eLink Physician-Brief Progress Note Patient Name: [name] DOB: 04-19-1960 MRN: [id number]   Date of Service  12/14/2014  HPI/Events of Note  Mucous plug >> hypoxia >> bradycardia >> cardiac arrest. CPR X 5 minutes. EPI IV given X 2. Several mucous plugs recovered by RT. ROSC.   eICU Interventions  Check ABG in 30 minutes.      Intervention Category Major Interventions: Code management / supervision  Lenell Antu 12/14/2014, 9:36 PM

## 2014-12-14 NOTE — Procedures (Signed)
History: 54 yo F s/p cardiac arrest  Sedation: Propofol  Technique: This is a 19 channel routine scalp EEG performed at the bedside with bipolar and monopolar montages arranged in accordance to the international 10/20 system of electrode placement. One channel was dedicated to EKG recording.    Background: The background consists of diffuse irregular generalized delta activity with a paucity of faster frequencies. There is no posterior dominant rhythm visualized. This pattern was relatively unchanging throughout the course of the recording.  Photic stimulation: Physiologic driving is not performed  EEG Abnormalities: 1) generalized irregular slow activity 2) absent PDR  Clinical Interpretation: This EEG is consistent with a generalized nonspecific cerebral dysfunction (encephalopathy). There was no seizure or seizure predisposition recorded on this study.   Compared to her previous recording, the burst suppression pattern that was present on the previous recording is no longer seen as the recording is no longer discontinuous.  [name] Slot, MD [facility] [phone]  If 7pm- 7am, please page neurology on call as listed in AMION.

## 2014-12-14 NOTE — Progress Notes (Signed)
CRITICAL VALUE ALERT  Critical value received:  Gram negative rods found in blood culture  Date of notification:  12/14/2014   Time of notification:  1014   Critical value read back:Yes.    Nurse who received alert:  Toula Moos   MD notified (1st page):  Jamison Neighbor, MD  Time of first page:  1020   Responding MD:  Jamison Neighbor, MD  Time MD responded:  [phone]

## 2014-12-14 NOTE — Code Documentation (Addendum)
  Patient Name: [name]   MRN: [id number]   Date of Birth/ Sex: 03-26-1961 , female      Admission Date: 12/21/2014  Attending Provider: Nelda Bucks, MD  Primary Diagnosis: <principal problem not specified>   Indication: Pt was in her usual state of health until this PM, when she was noted to be bradycardic without pulse. Code blue was subsequently called. At the time of arrival on scene, ACLS protocol was underway.   Technical Description:  - CPR performance duration:  10  minutes  - Was defibrillation or cardioversion used? No   - Was external pacer placed? Yes  - Was patient intubated pre/post CPR? Yes   Medications Administered: Y = Yes; Blank = No Amiodarone    Atropine    Calcium    Epinephrine  2  Lidocaine    Magnesium    Norepinephrine    Phenylephrine    Sodium bicarbonate    Vasopressin     Post CPR evaluation:  - Final Status - Was patient successfully resuscitated ? Yes - What is current rhythm? Tachycardia, ECG pending - What is current hemodynamic status? stable  Miscellaneous Information:  - Labs sent, including: ABG, Troponin, CMP  - Primary team notified?  Yes  - Family Notified? No  - Additional notes/ transfer status:  Dr. Marchelle Gearing present to bronch patient.     Jana Half, MD  12/14/2014, 9:38 PM

## 2014-12-14 NOTE — Progress Notes (Addendum)
230 ml of Fentanyl (45mcg/ml) wasted in sink. Witnessed by Martie Lee, RN and Isabella Bowens, RN

## 2014-12-14 NOTE — Progress Notes (Signed)
CRITICAL VALUE ALERT  Critical value received:  Troponin 2.4  Date of notification:  12/14/2014  Time of notification:  2245  Critical value read back:Yes.    Nurse who received alert:  Kavin Leech  2300: Called ELink and left message for MD to call back to discuss critical troponin.  2310: Received call from radiology with chest x-ray results.  Meredith Pel, MD still unavailable.  Reported value to RN and also advised of Troponin value.  Ivery Quale, RN

## 2014-12-14 NOTE — Progress Notes (Signed)
EEG Completed; Results Pending  

## 2014-12-14 NOTE — Procedures (Addendum)
Bronchoscopy Procedure Note Marishka Rentfrow [id number] January 16, 1961  Procedure: Bronchoscopy Indications: Diagnostic evaluation of the airways  Procedure Details Consent: Unable to obtain consent because of emergent medical necessity. Time Out: Verified patient identification, verified procedure, site/side was marked, verified correct patient position, special equipment/implants available, medications/allergies/relevent history reviewed, required imaging and test results available.  Performed  In preparation for procedure, previously sedated wtih diprivan and on vent.  Airway entered and the following bronchi were examined: RUL, RML, RLL, LUL, LLL and Bronchi.     Patient all of a sudden noticed to have vent dysnchrony, high PiP, difficulty bagging. RN suction got some mucus plugs from ET tube but problem persisted. I was at [facility] long when I got call around 21:21. By time of my arrival at 21:35h patient had coded and received cPR with rOSC and hypotension with order for pressors in place.  Flex bronch inserted due to concerns of oingoing mucus plug. In middle of ET tube blocking 25% of et tub lumen wa a mucus plug that was suctioned and then washed off with saline. The carina looked chronically swollen. Airway exam done in detail. No endobronchial lesion. Brawny edema of airway and subsegments noted. Some mucus in left lower lobe but none able to aspirate. Post bronch patient was having desaturations and I ordered cxr to rule out PTx   Son Mr Corns updated over phone 10:02 PM 12/14/2014. Explained that there was potential for adding insult to prior anoxia.     Bronchoscope removed.    Evaluation Hemodynamic Status: Persistent hypotension treated with pressors; O2 sats: stable throughout Patient's Current Condition: stable Specimens:  None Complications: No apparent complications Patient did tolerate procedure well.   Arieonna Medine 12/14/2014

## 2014-12-14 NOTE — Progress Notes (Signed)
PULMONARY / CRITICAL CARE MEDICINE   Name: [name] MRN: [id number] DOB: 28-Sep-1960    ADMISSION DATE:  December 14, 2014 CONSULTATION DATE:  12/14/14  REFERRING MD :  ED  CHIEF COMPLAINT:  Asystolic Arrest  INITIAL PRESENTATION: Out of hospital arrest. Patient called EMS w/ dyspnea. Found pulseless on their arrival. 6 minutes of CPR for ROSC. Suspected aspiration in transit to ED because of [facility] airway.  STUDIES:  EEG 9/5 - burst suppression but also on Propofol. pCXR 9/6 - progressive LLL opacity & persitent RML opacity. CTA Chest 9/6 - No PE. Right mainstem intubation. Atelectasis. Bilateral upper lobe opacities. Mild cardiomegaly. CT Head w/o 9/6 - No acute osseus abnormality. Gray-white matter diff preserved. No hemorrhage or increased ICP. pCXR 9/7 - ETT repositioned appropriately.  SIGNIFICANT EVENTS: 9/5 - Intubated in ED 9/5 - Cooled w/ normothermia protocol  SUBJECTIVE: No events overnight. Nurse reports some posturing with tapering of sedation last night. EEG pending. ETT repositioned after found in right mainstem on chest CT 9/6.  ROS:  Unobtainable as patient is intubated & sedated.  VITAL SIGNS: Temp:  [96.6 F (35.9 C)-100.2 F (37.9 C)] 98.2 F (36.8 C) (09/07 0800) Pulse Rate:  [85-121] 115 (09/07 0800) Resp:  [12-20] 14 (09/07 0800) BP: (78-188)/(35-138) 115/57 mmHg (09/07 0800) SpO2:  [90 %-100 %] 98 % (09/07 0800) Arterial Line BP: (77-165)/(42-77) 116/56 mmHg (09/07 0800) FiO2 (%):  [40 %] 40 % (09/07 0800) Weight:  [140 lb 6.9 oz (63.7 kg)] 140 lb 6.9 oz (63.7 kg) (09/07 0400) HEMODYNAMICS:   VENTILATOR SETTINGS: Vent Mode:  [-] PRVC FiO2 (%):  [40 %] 40 % Set Rate:  [14 bmp] 14 bmp Vt Set:  [420 mL] 420 mL PEEP:  [5 cmH20] 5 cmH20 Plateau Pressure:  [9 cmH20-25 cmH20] 9 cmH20 INTAKE / OUTPUT:  Intake/Output Summary (Last 24 hours) at 12/14/14 0906 Last data filed at 12/14/14 0800  Gross per 24 hour  Intake 2739.84 ml  Output    885 ml  Net  1854.84 ml    PHYSICAL EXAMINATION: General:  Sedated. No acute distress. Son & sister at bedside.  Integument:  Warm & dry. No rash on exposed skin. Cooling pads in place. HEENT:  No scleral injection or icterus. Endotracheal tube in place.  Cardiovascular:  Regular rhythm but tachycardic. Sinus tach on tele. No edema.  Pulmonary:  Coarse breath sounds bilaterally on ventilator. Symmetric chest wall rise.  Abdomen: Soft. Normal bowel sounds. Nondistended. Neurological:  PERRL. Doesn't withdraw to pain on sedation. Cough present. Left corneal reflex present but right not.  LABS:  CBC  Recent Labs Lab 12-14-14 0411 12/13/14 0317 12/14/14 0400  WBC 13.6* 8.1 7.5  HGB 15.0 12.4 11.5*  HCT 44.0 36.5 34.9*  PLT 160 144* 137*   Coag's  Recent Labs Lab 2014/12/14 0045 12-14-2014 0411  APTT 28 26  INR 1.03 1.08   BMET  Recent Labs Lab 12/13/14 0317 12/13/14 1120 12/14/14 0400  NA 139 139 135  K 3.2* 4.3 4.4  CL 106 108 102  CO2 BUN CREATININE 0.90 0.81 0.69  GLUCOSE 133* 105* 99   Electrolytes  Recent Labs Lab 12/13/14 0317 12/13/14 1120 12/14/14 0400  CALCIUM 8.2* 8.5* 8.4*  MG 1.8  --  1.8  PHOS 3.0  --  3.5   Sepsis Markers  Recent Labs Lab 2014/12/14 0044 December 14, 2014 0110  LATICACIDVEN 5.0* 5.05*   ABG  Recent Labs Lab 12/14/2014 0115 12-14-2014 0446  12/13/14 0400  PHART 7.195* 7.310* 7.402  PCO2ART 54.8* 49.5* 39.1  PO2ART 172.0* 363.0* 80.0   Liver Enzymes  Recent Labs Lab 12/13/14 1120 12/14/14 0400  AST 23 29  ALT 22 19  ALKPHOS 51 48  BILITOT 0.5 0.4  ALBUMIN 3.1* 2.7*   Cardiac Enzymes  Recent Labs Lab January 02, 2015 0845 01/02/15 1505 Jan 02, 2015 2140  TROPONINI 0.18* 0.17* 0.18*   Glucose  Recent Labs Lab 12/13/14 1113 12/13/14 1722 12/13/14 1950 12/13/14 2358 12/14/14 0359 12/14/14 0815  GLUCAP 99 122* 131* 129* 92 95    Imaging Ct Head Wo Contrast  12/13/2014   CLINICAL DATA:  54 year old female  found pulseless by EMS, status post CPR. Status post hypothermia protocol. Initial encounter.  EXAM: CT HEAD WITHOUT CONTRAST  TECHNIQUE: Contiguous axial images were obtained from the base of the skull through the vertex without intravenous contrast.  COMPARISON:  [facility] head CT without contrast 02/06/2009.  FINDINGS: Mild paranasal sinus mucosal thickening. Fluid in the pharynx. 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 Mastoids and tympanic cavities are clear. No acute osseous abnormality identified.  Visualized orbits and scalp soft tissues are within normal limits.  Cerebral volume and ventricles are stable compared to 2010. No ventriculomegaly. Basilar cisterns appear stable. No midline shift or intracranial mass effect is evident. No acute cortically based infarct. Gray-white matter differentiation appears stable throughout the brain. No acute intracranial hemorrhage identified. Mild Calcified atherosclerosis at the skull base.  IMPRESSION: Stable and normal noncontrast CT appearance of the brain when compared to 2010 study.   Electronically Signed   By: Odessa Fleming M.D.   On: 12/13/2014 17:06   Ct Angio Chest Pe W/cm &/or Wo Cm  12/13/2014   CLINICAL DATA:  54 year old female with respiratory failure.  EXAM: CT ANGIOGRAPHY CHEST WITH CONTRAST  TECHNIQUE: Multidetector CT imaging of the chest was performed using the standard protocol during bolus administration of intravenous contrast. Multiplanar CT image reconstructions and MIPs were obtained to evaluate the vascular anatomy.  CONTRAST:  80mL OMNIPAQUE IOHEXOL 350 MG/ML SOLN  COMPARISON:  Chest radiograph from earlier today. No prior chest CT. 01/18/2013 CT abdomen/pelvis.  FINDINGS: Mediastinum/Nodes: Mild cardiomegaly. Mild pericardial fluid/thickening. There is atherosclerosis of the thoracic aorta, the great vessels of the mediastinum and the coronary arteries, including calcified atherosclerotic  plaque in the at left main, left anterior descending, left circumflex and right coronary arteries. Evaluation of the main pulmonary artery is limited by motion artifact. Main pulmonary artery is normal caliber. Mildly atherosclerotic nonaneurysmal thoracic aorta. No filling defects are seen in the central, lobar, segmental or subsegmental pulmonary arteries. Subcentimeter calcification in the anterior left thyroid lobe. Normal esophagus. Enteric tube enters the proximal stomach, with the tip not seen on this study. No axillary, mediastinal or hilar lymphadenopathy. Left subclavian central venous catheter terminates at the cavoatrial junction.  Lungs/Pleura: Endotracheal tube tip is in the proximal right mainstem bronchus near the carina. No pneumothorax. No pleural effusion. There is segmental atelectasis in the medial lower lobes. There is subsegmental atelectasis in the medial right middle lobe and lingula. There is patchy ground-glass opacity and mild patchy airspace consolidation in the medial upper lobes bilaterally. No significant pulmonary nodules or lung masses in the aerated portions of the lungs.  Upper abdomen: Unremarkable.  Musculoskeletal: No aggressive appearing focal osseous lesions.  Review of the MIP images confirms the above findings.  IMPRESSION: 1. No pulmonary embolism. 2. Right mainstem bronchus intubation, recommend  retracting the endotracheal tube by 2 cm. 3. Patchy ground-glass opacity and consolidation in the bilateral medial upper lobes, likely atelectasis and/ or pneumonia. Moderate bilateral lower lobe atelectasis. 4. Mild pericardial fluid/thickening. Mild cardiomegaly. 5. Atherosclerosis, including left main and 3 vessel coronary artery disease. Please note that although the presence of coronary artery calcium documents the presence of coronary artery disease, the severity of this disease and any potential stenosis cannot be assessed on this non-gated CT examination. Assessment for  potential risk factor modification, dietary therapy or pharmacologic therapy may be warranted, if clinically indicated. These results were called by telephone at the time of interpretation on 12/13/2014 at 4:58 pm to Dr. Brandy Hale, who verbally acknowledged these results.   Electronically Signed   By: Delbert Phenix M.D.   On: 12/13/2014 17:08   Dg Chest Port 1 View  12/14/2014   CLINICAL DATA:  Hypoxia  EXAM: PORTABLE CHEST - 1 VIEW  COMPARISON:  December 13, 2014 chest radiograph and chest CT  FINDINGS: Endotracheal tube tip is 3.5 cm above the carina. Nasogastric tube tip and side port are in the stomach. Central catheter tip is in the right atrium just beyond the cavoatrial junction. No pneumothorax. There is mild bibasilar atelectasis. Lungs elsewhere clear. Heart size and pulmonary vascularity are normal. No adenopathy.  IMPRESSION: Tube and catheter positions as described without pneumothorax. Note that the endotracheal tube has been repositioned compared to 1 day prior. There is bibasilar atelectasis. No new opacity. No change in cardiac silhouette.   Electronically Signed   By: Bretta Bang III M.D.   On: 12/14/2014 08:31     ASSESSMENT / PLAN:  PULMONARY OETT 7.5 9/5>> A: Acute Hypoxic Respiratory Failure Respiratory Arrest Probable aspiration H/O COPD Ongoing tobacco use  P:   CTA chest to rule out PE Xopenex nebs every 6 hours Vent bundle Wean FiO2 for Sat >94% See ID for aspiration  CARDIOVASCULAR L [place] CVL 9/5>> A:  Asystolic Arrest Hypertension Sinus Tachycardia  P:  Cardiology consulted & following peripherally Continue cooling protocol TTE w/o any acue change Labetalol IV prn Holding Lopressor given hypotension  RENAL A:   Hypokalemia - resolved  P:   Monitor electrolytes daily Monitor renal function w/ daily BUN/Creatinine Monitor UOP with foley catheter  GASTROINTESTINAL A:   No acute issues No BM on Tube Feeds  P:   TF per dietary  recommendations Pepcid IV q12hr Colace & Senna via tube bid  HEMATOLOGIC A:   No acute issues.  P:  SCDs Heparin Delta q8hr Monitor Hgb daily w/ CBC  INFECTIOUS A:   Aspiration pneumonitis vs pneumonia Possible Bacteremia - likely contaminate  P:   Monitor for leukocytosis with daily CBC  BCx2 9/5>>GPR 1/2 bottles UC 9/5>>negative Sputum 9/5>>  Abx:  Unasyn, start date 9/5, day 3/x Vancomycin, start date 9/6, day 2/x  ENDOCRINE A:   H/O DM    P:   Accuchecks q4hr SSI coverage per algorithm  NEUROLOGIC A:   Possible Anoxic Brain Injury Burst Suppression on EEG  P:   RASS goal: 0 Propofol gtt Fentanyl gtt Ativan IV prn Repeat EEG off propofol pending Neuro following CT head negative  FAMILY  - Updates: Son & sister updated at beside by me.  - Inter-disciplinary family meet or Palliative Care meeting due by:  9/12   TODAY'S SUMMARY: 54 year old female with reported history of COPD & ongoing tobacco use. Patient contacted EMS for dyspnea and on arrival was found to be in asystole.  Arrest likely precipitated by respiratory event. Patient in the process of rewarming which will complete tomorrow morning early. Neurological prognosis still guarded. Repeating EEG off of sedation today. Family questioned whether it was too early to involve hospice and I reassured them. Palliative care consult at their request yesterday.  I have spent a total of 39 minutes of critical care time today caring for the patient, reviewing the patient's electronic medical record, & updating the patient's family at bedside.  Donna Christen Jamison Neighbor, M.D. [facility] Pulmonary & Critical Care Pager:  [phone] After 3pm or if no response, call [phone] 12/14/2014, 9:06 AM

## 2014-12-14 NOTE — Progress Notes (Signed)
   12/14/14 0950  Clinical Encounter Type  Visited With Patient;Family  Visit Type Initial  Referral From Nurse  Recommendations Continue a follow-up visit in 2-3 days  Spiritual Encounters  Spiritual Needs Prayer;Emotional  Stress Factors  Family Stress Factors Health changes;Other (Comment) (Concerned about persistent health changes)  Chaplain visited with family members sister and son of the patient;Chaplain acknowledged birthday; chaplain showed support for family members

## 2014-12-15 ENCOUNTER — Inpatient Hospital Stay (HOSPITAL_COMMUNITY): Payer: Medicare HMO

## 2014-12-15 DIAGNOSIS — J989 Respiratory disorder, unspecified: Secondary | ICD-10-CM

## 2014-12-15 DIAGNOSIS — J9383 Other pneumothorax: Secondary | ICD-10-CM

## 2014-12-15 DIAGNOSIS — J939 Pneumothorax, unspecified: Secondary | ICD-10-CM

## 2014-12-15 DIAGNOSIS — I468 Cardiac arrest due to other underlying condition: Secondary | ICD-10-CM

## 2014-12-15 LAB — CBC WITH DIFFERENTIAL/PLATELET
BASOS PCT: 0 % (ref 0–1)
Basophils Absolute: 0 10*3/uL (ref 0.0–0.1)
EOS ABS: 0 10*3/uL (ref 0.0–0.7)
Eosinophils Relative: 0 % (ref 0–5)
HCT: 30.8 % — ABNORMAL LOW (ref 36.0–46.0)
Hemoglobin: 10.1 g/dL — ABNORMAL LOW (ref 12.0–15.0)
Lymphocytes Relative: 4 % — ABNORMAL LOW (ref 12–46)
Lymphs Abs: 0.3 10*3/uL — ABNORMAL LOW (ref 0.7–4.0)
MCH: 30.5 pg (ref 26.0–34.0)
MCHC: 32.8 g/dL (ref 30.0–36.0)
MCV: 93.1 fL (ref 78.0–100.0)
MONO ABS: 0.5 10*3/uL (ref 0.1–1.0)
MONOS PCT: 6 % (ref 3–12)
NEUTROS PCT: 90 % — AB (ref 43–77)
Neutro Abs: 6.7 10*3/uL (ref 1.7–7.7)
Platelets: 123 10*3/uL — ABNORMAL LOW (ref 150–400)
RBC: 3.31 MIL/uL — ABNORMAL LOW (ref 3.87–5.11)
RDW: 13.2 % (ref 11.5–15.5)
WBC: 7.5 10*3/uL (ref 4.0–10.5)

## 2014-12-15 LAB — TROPONIN I
Troponin I: 1.74 ng/mL (ref <0.031)
Troponin I: 1.72 ng/mL (ref <0.031)

## 2014-12-15 LAB — TRIGLYCERIDES: TRIGLYCERIDES: 187 mg/dL — AB (ref <150)

## 2014-12-15 LAB — BLOOD GAS, ARTERIAL
Acid-base deficit: 0.5 mmol/L (ref 0.0–2.0)
BICARBONATE: 24.7 meq/L — AB (ref 20.0–24.0)
FIO2: 0.4
LHR: 14 {breaths}/min
O2 SAT: 97.7 %
PATIENT TEMPERATURE: 98.6
PCO2 ART: 48.6 mmHg — AB (ref 35.0–45.0)
PEEP: 5 cmH2O
PH ART: 7.326 — AB (ref 7.350–7.450)
TCO2: 26.2 mmol/L (ref 0–100)
VT: 420 mL
pO2, Arterial: 101 mmHg — ABNORMAL HIGH (ref 80.0–100.0)

## 2014-12-15 LAB — RENAL FUNCTION PANEL
Albumin: 2.3 g/dL — ABNORMAL LOW (ref 3.5–5.0)
Anion gap: 8 (ref 5–15)
BUN: 13 mg/dL (ref 6–20)
CHLORIDE: 103 mmol/L (ref 101–111)
CO2: 26 mmol/L (ref 22–32)
CREATININE: 0.85 mg/dL (ref 0.44–1.00)
Calcium: 8.1 mg/dL — ABNORMAL LOW (ref 8.9–10.3)
GFR calc Af Amer: >60 mL/min (ref >60)
GFR calc non Af Amer: >60 mL/min (ref >60)
Glucose, Bld: 162 mg/dL — ABNORMAL HIGH (ref 65–99)
Phosphorus: 3.1 mg/dL (ref 2.5–4.6)
Potassium: 3.8 mmol/L (ref 3.5–5.1)
Sodium: 137 mmol/L (ref 135–145)

## 2014-12-15 LAB — GLUCOSE, CAPILLARY
GLUCOSE-CAPILLARY: 165 mg/dL — AB (ref 65–99)
GLUCOSE-CAPILLARY: 151 mg/dL — AB (ref 65–99)
GLUCOSE-CAPILLARY: 152 mg/dL — AB (ref 65–99)
Glucose-Capillary: 141 mg/dL — ABNORMAL HIGH (ref 65–99)
Glucose-Capillary: 118 mg/dL — ABNORMAL HIGH (ref 65–99)
Glucose-Capillary: 151 mg/dL — ABNORMAL HIGH (ref 65–99)

## 2014-12-15 LAB — PROCALCITONIN: Procalcitonin: 2.32 ng/mL

## 2014-12-15 LAB — VANCOMYCIN, TROUGH: VANCOMYCIN TR: 18 ug/mL (ref 10.0–20.0)

## 2014-12-15 LAB — CULTURE, BLOOD (ROUTINE X 2)

## 2014-12-15 LAB — MAGNESIUM: MAGNESIUM: 1.7 mg/dL (ref 1.7–2.4)

## 2014-12-15 LAB — STREP PNEUMONIAE URINARY ANTIGEN: Strep Pneumo Urinary Antigen: NEGATIVE

## 2014-12-15 MED ORDER — ACETAMINOPHEN 160 MG/5ML PO SOLN
650.0000 mg | Freq: Four times a day (QID) | ORAL | Status: DC | PRN
  Administered 2014-12-15 – 2014-12-16 (×3): 650 mg
  Filled 2014-12-15 (×3): qty 20.3

## 2014-12-15 NOTE — Progress Notes (Signed)
Post code and after ETT repositioned re-auscultated OG tube. Auscultation very faint. Paused tube feed and ordered a KUB to confirm OG tube placement.  Ivery Quale, RN

## 2014-12-15 NOTE — Progress Notes (Signed)
eLink Physician-Brief Progress Note Patient Name: [name] DOB: 1961/03/02 MRN: [id number]   Date of Service  12/15/2014  HPI/Events of Note  Fever - Temp = 100.8 F. Patient is already on Vancomycin and Unasyn.  eICU Interventions  Will order Tylenol liquid 650 mg via tube Q 6 hours PRN Temp > 101.5 F.     Intervention Category Intermediate Interventions: Infection - evaluation and management  Sommer,Steven Eugene 12/15/2014, 5:37 PM

## 2014-12-15 NOTE — Progress Notes (Signed)
Subjective: Remains intubated and on sedation. Code blue overnight with 10 minutes of CPR. Chest tube placed secondary to left pneumothorax.   EEG completed, burst suppression has resolved, currently showing generalized irregular slowing.   Objective: Current vital signs: BP 151/62 mmHg  Pulse 104  Temp(Src) 99 F (37.2 C) (Core (Comment))  Resp 14  Ht 5\' 2"  (1.575 m)  Wt 63.1 kg (139 lb 1.8 oz)  BMI 25.44 kg/m2  SpO2 99%  LMP 06/26/2011 Vital signs in last 24 hours: Temp:  [96.3 F (35.7 C)-100.6 F (38.1 C)] 99 F (37.2 C) (09/08 0700) Pulse Rate:  [81-126] 104 (09/08 0718) Resp:  [13-23] 14 (09/08 0718) BP: (66-181)/(33-96) 151/62 mmHg (09/08 0718) SpO2:  [86 %-100 %] 99 % (09/08 0728) Arterial Line BP: (48-199)/(34-89) 104/47 mmHg (09/08 0600) FiO2 (%):  [40 %] 40 % (09/08 0728) Weight:  [63.1 kg (139 lb 1.8 oz)] 63.1 kg (139 lb 1.8 oz) (09/08 0416)  Intake/Output from previous day: 09/07 0701 - 09/08 0700 In: 3690.1 [I.V.:726.8; NG/GT:1133.3; IV Piggyback:1830] Out: 1615 [Urine:1615] Intake/Output this shift:   Nutritional status:    Neurologic Exam: Mental Status: Nonverbal, appears to attempt to open eyes when her name is yelled, otherwise not following commands.   Cranial Nerves: II: patient does not respond confrontation bilaterally, pupils right 2 mm, left 2 mm,and reactive bilaterally III,IV,VI: doll's response absent bilaterally.  V,VII: corneal reflex present on the left but decreased on the right VIII: patient does not respond to verbal stimuli IX,X: weak gag reflex present, XI: trapezius strength unable to test bilaterally XII: tongue strength unable to test Motor: Extremities flaccid throughout. No spontaneous movement noted. No purposeful movements noted. Sensory: Does not respond to noxious stimuli in any extremity.  Lab Results: Basic Metabolic Panel:  Recent Labs Lab 12/30/2014 1505 12/13/14 0317 12/13/14 1120 12/14/14 0400  12/15/14 0320  NA 141 139 139 135 137  K 3.9 3.2* 4.3 4.4 3.8  CL 108 106 108 102 103  CO2 24 24 24 26 26   GLUCOSE 83 133* 105* 99 162*  BUN 15 16 17 14 13   CREATININE 0.97 0.90 0.81 0.69 0.85  CALCIUM 8.4* 8.2* 8.5* 8.4* 8.1*  MG  --  1.8  --  1.8 1.7  PHOS  --  3.0  --  3.5 3.1    Liver Function Tests:  Recent Labs Lab 12/13/14 1120 12/14/14 0400 12/15/14 0320  AST 23 29  --   ALT 22 19  --   ALKPHOS 51 48  --   BILITOT 0.5 0.4  --   PROT 5.7* 5.4*  --   ALBUMIN 3.1* 2.7* 2.3*   No results for input(s): LIPASE, AMYLASE in the last 168 hours. No results for input(s): AMMONIA in the last 168 hours.  CBC:  Recent Labs Lab 01/01/2015 0045 12/19/2014 0411 12/13/14 0317 12/14/14 0400 12/15/14 0320  WBC 14.9* 13.6* 8.1 7.5 7.5  NEUTROABS  --   --   --  5.5 6.7  HGB 14.4 15.0 12.4 11.5* 10.1*  HCT 42.5 44.0 36.5 34.9* 30.8*  MCV 94.0 92.8 90.6 93.8 93.1  PLT 258 160 144* 137* 123*    Cardiac Enzymes:  Recent Labs Lab 12/23/2014 0845 01/03/2015 1505 12/31/2014 2140 12/14/14 2137 12/15/14 0320  TROPONINI 0.18* 0.17* 0.18* 2.40* 1.74*    Lipid Panel:  Recent Labs Lab 12/15/14 0320  TRIG 187*    CBG:  Recent Labs Lab 12/14/14 1129 12/14/14 1658 12/14/14 2028 12/15/14 0104 12/15/14 0420  GLUCAP  115* 134* 121* 165* 141*    Microbiology: Results for orders placed or performed during the hospital encounter of 12/31/2014  MRSA PCR Screening     Status: None   Collection Time: 12/16/2014  2:57 AM  Result Value Ref Range Status   MRSA by PCR NEGATIVE NEGATIVE Final    Comment:        The GeneXpert MRSA Assay (FDA approved for NASAL specimens only), is one component of a comprehensive MRSA colonization surveillance program. It is not intended to diagnose MRSA infection nor to guide or monitor treatment for MRSA infections.   Urine culture     Status: None   Collection Time: 12/13/2014 11:44 AM  Result Value Ref Range Status   Specimen Description  URINE, CATHETERIZED  Final   Special Requests NONE  Final   Culture NO GROWTH 1 DAY  Final   Report Status 12/13/2014 FINAL  Final  Culture, respiratory (NON-Expectorated)     Status: None   Collection Time: 12/16/2014 12:10 PM  Result Value Ref Range Status   Specimen Description TRACHEAL ASPIRATE  Final   Special Requests Normal  Final   Gram Stain   Final    RARE WBC PRESENT,BOTH PMN AND MONONUCLEAR NO SQUAMOUS EPITHELIAL CELLS SEEN NO ORGANISMS SEEN Performed at [facility]    Culture   Final    Non-Pathogenic Oropharyngeal-type Flora Isolated. Performed at [facility]    Report Status 12/14/2014 FINAL  Final  Culture, blood (routine x 2)     Status: None (Preliminary result)   Collection Time: 12/18/2014  1:00 PM  Result Value Ref Range Status   Specimen Description BLOOD RIGHT HAND  Final   Special Requests BOTTLES DRAWN AEROBIC ONLY 2CC  Final   Culture NO GROWTH 2 DAYS  Final   Report Status PENDING  Incomplete  Culture, blood (routine x 2)     Status: None (Preliminary result)   Collection Time: 12/14/2014  1:15 PM  Result Value Ref Range Status   Specimen Description BLOOD LEFT HAND  Final   Special Requests BOTTLES DRAWN AEROBIC ONLY 1.5CC  Final   Culture  Setup Time   Final    GRAM POSITIVE RODS AEROBIC BOTTLE ONLY CRITICAL RESULT CALLED TO, READ BACK BY AND VERIFIED WITH: P DAVIS,RN AT 1343 12/13/14 BY L BENFIELD    Culture   Final    GRAM NEGATIVE RODS CRITICAL RESULT CALLED TO, READ BACK BY AND VERIFIED WITH: F ABERION,RN AT 1013 12/14/14 BY L BENFIELD GRAM POSITIVE RODS    Report Status PENDING  Incomplete    Coagulation Studies: No results for input(s): LABPROT, INR in the last 72 hours.  Imaging: Ct Head Wo Contrast  12/13/2014   CLINICAL DATA:  54 year old female found pulseless by EMS, status post CPR. Status post hypothermia protocol. Initial encounter.  EXAM: CT HEAD WITHOUT CONTRAST  TECHNIQUE: Contiguous axial images were obtained from  the base of the skull through the vertex without intravenous contrast.  COMPARISON:  [facility] head CT without contrast 02/06/2009.  FINDINGS: Mild paranasal sinus mucosal thickening. Fluid in the pharynx. 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 Mastoids and tympanic cavities are clear. No acute osseous abnormality identified.  Visualized orbits and scalp soft tissues are within normal limits.  Cerebral volume and ventricles are stable compared to 2010. No ventriculomegaly. Basilar cisterns appear stable. No midline shift or intracranial mass effect is evident. No acute cortically based infarct. Gray-white matter  differentiation appears stable throughout the brain. No acute intracranial hemorrhage identified. Mild Calcified atherosclerosis at the skull base.  IMPRESSION: Stable and normal noncontrast CT appearance of the brain when compared to 2010 study.   Electronically Signed   By: Odessa Fleming M.D.   On: 12/13/2014 17:06   Ct Angio Chest Pe W/cm &/or Wo Cm  12/13/2014   CLINICAL DATA:  54 year old female with respiratory failure.  EXAM: CT ANGIOGRAPHY CHEST WITH CONTRAST  TECHNIQUE: Multidetector CT imaging of the chest was performed using the standard protocol during bolus administration of intravenous contrast. Multiplanar CT image reconstructions and MIPs were obtained to evaluate the vascular anatomy.  CONTRAST:  80mL OMNIPAQUE IOHEXOL 350 MG/ML SOLN  COMPARISON:  Chest radiograph from earlier today. No prior chest CT. 01/18/2013 CT abdomen/pelvis.  FINDINGS: Mediastinum/Nodes: Mild cardiomegaly. Mild pericardial fluid/thickening. There is atherosclerosis of the thoracic aorta, the great vessels of the mediastinum and the coronary arteries, including calcified atherosclerotic plaque in the at left main, left anterior descending, left circumflex and right coronary arteries. Evaluation of the main pulmonary artery is limited by motion artifact. Main  pulmonary artery is normal caliber. Mildly atherosclerotic nonaneurysmal thoracic aorta. No filling defects are seen in the central, lobar, segmental or subsegmental pulmonary arteries. Subcentimeter calcification in the anterior left thyroid lobe. Normal esophagus. Enteric tube enters the proximal stomach, with the tip not seen on this study. No axillary, mediastinal or hilar lymphadenopathy. Left subclavian central venous catheter terminates at the cavoatrial junction.  Lungs/Pleura: Endotracheal tube tip is in the proximal right mainstem bronchus near the carina. No pneumothorax. No pleural effusion. There is segmental atelectasis in the medial lower lobes. There is subsegmental atelectasis in the medial right middle lobe and lingula. There is patchy ground-glass opacity and mild patchy airspace consolidation in the medial upper lobes bilaterally. No significant pulmonary nodules or lung masses in the aerated portions of the lungs.  Upper abdomen: Unremarkable.  Musculoskeletal: No aggressive appearing focal osseous lesions.  Review of the MIP images confirms the above findings.  IMPRESSION: 1. No pulmonary embolism. 2. Right mainstem bronchus intubation, recommend retracting the endotracheal tube by 2 cm. 3. Patchy ground-glass opacity and consolidation in the bilateral medial upper lobes, likely atelectasis and/ or pneumonia. Moderate bilateral lower lobe atelectasis. 4. Mild pericardial fluid/thickening. Mild cardiomegaly. 5. Atherosclerosis, including left main and 3 vessel coronary artery disease. Please note that although the presence of coronary artery calcium documents the presence of coronary artery disease, the severity of this disease and any potential stenosis cannot be assessed on this non-gated CT examination. Assessment for potential risk factor modification, dietary therapy or pharmacologic therapy may be warranted, if clinically indicated. These results were called by telephone at the time of  interpretation on 12/13/2014 at 4:58 pm to Dr. Brandy Hale, who verbally acknowledged these results.   Electronically Signed   By: Delbert Phenix M.D.   On: 12/13/2014 17:08   Dg Chest Port 1 View  12/15/2014   CLINICAL DATA:  Left-sided chest tube placed.  EXAM: PORTABLE CHEST - 1 VIEW  COMPARISON:  12/14/2014  FINDINGS: Endotracheal tube tip measures 2.2 cm above the carina. Enteric tube tip is off the field of view but below the left hemidiaphragm. Left central venous catheter tip over the cavoatrial junction region. New placement of a left chest tube. The proximal side hole appears to be outside of the rib cage. Left pneumothorax has decreased. Persistent extensive subcutaneous emphysema, pneumomediastinum, and probable pneumoperitoneum. Diffuse airspace disease throughout the lungs.  Normal heart size.  IMPRESSION: Left chest tube placed with proximal side hole appearing to be outside of the ribcage. Decreased left pneumothorax. Extensive subcutaneous emphysema, pneumomediastinum and probable pneumoperitoneum. Diffuse bilateral pulmonary infiltrates.  These results were called by telephone at the time of interpretation on 12/15/2014 at 1:12 am to [facility], who verbally acknowledged these results.   Electronically Signed   By: Burman Nieves M.D.   On: 12/15/2014 01:14   Dg Chest Port 1 View  12/14/2014   CLINICAL DATA:  Hypoxia  EXAM: PORTABLE CHEST - 1 VIEW  COMPARISON:  December 13, 2014 chest radiograph and chest CT  FINDINGS: Endotracheal tube tip is 3.5 cm above the carina. Nasogastric tube tip and side port are in the stomach. Central catheter tip is in the right atrium just beyond the cavoatrial junction. No pneumothorax. There is mild bibasilar atelectasis. Lungs elsewhere clear. Heart size and pulmonary vascularity are normal. No adenopathy.  IMPRESSION: Tube and catheter positions as described without pneumothorax. Note that the endotracheal tube has been repositioned compared to 1 day prior. There is  bibasilar atelectasis. No new opacity. No change in cardiac silhouette.   Electronically Signed   By: Bretta Bang III M.D.   On: 12/14/2014 08:31   Dg Chest Port 1v Same Day  12/14/2014   CLINICAL DATA:  Post CPR.  Oxygen decrease.  EXAM: PORTABLE CHEST - 1 VIEW SAME DAY  COMPARISON:  12/14/2014  FINDINGS: Endotracheal tube is present with tip measuring 2.2 cm above the carina. Enteric tube is present with tip off the field of view but below the left hemidiaphragm. Left central venous catheter with tip over the low SVC. Appliances are unchanged in position since previous study. Shallow inspiration. There is atelectasis or infiltration in the lung bases with mild perihilar infiltration bilaterally. Since the previous study, there is interval development of severe subcutaneous emphysema throughout the left chest wall, focally in the right lateral chest wall, and in the neck base bilaterally. Mild pneumo mediastinum. Probable free air under the right hemidiaphragm. Lucency at the left costophrenic angle suspicious for left pneumothorax. Probable left anterior rib fractures.  IMPRESSION: Appliances appear in satisfactory location. Shallow inspiration with basilar infiltration or atelectasis. Bilateral perihilar infiltration. Diffuse soft tissue emphysema throughout the left chest, base of neck, and right lateral chest. Pneumo mediastinum. A left pneumothorax. Probable free air under the right hemidiaphragm.  These results were called by telephone at the time of interpretation on 12/14/2014 at 11:07 pm to RN Isaiah Serge, who verbally acknowledged these results.   Electronically Signed   By: Burman Nieves M.D.   On: 12/14/2014 23:09   Dg Abd Portable 1v  12/15/2014   CLINICAL DATA:  OG tube placement.  EXAM: PORTABLE ABDOMEN - 1 VIEW  COMPARISON:  Chest 12/15/2014  FINDINGS: Subcutaneous emphysema along the lateral left abdominal wall. Enteric tube with tip in the left upper quadrant consistent with location in the  body of the stomach. No evidence of bowel distention.  IMPRESSION: Enteric tube tip localizes to the left upper quadrant consistent with location in the body of the stomach. Soft tissue emphysema in the abdominal wall.   Electronically Signed   By: Burman Nieves M.D.   On: 12/15/2014 04:31    Medications:  Scheduled: . acetylcysteine  3 mL Nebulization 6 times per day  . ampicillin-sulbactam (UNASYN) IV  3 g Intravenous Q6H  . antiseptic oral rinse  7 mL Mouth Rinse 10 times per day  . chlorhexidine gluconate  15 mL Mouth Rinse BID  . docusate sodium  100 mg Oral BID  . famotidine (PEPCID) IV  20 mg Intravenous Q12H  . feeding supplement (VITAL HIGH PROTEIN)  1,000 mL Per Tube Q24H  . heparin  5,000 Units Subcutaneous 3 times per day  . insulin aspart  2-6 Units Subcutaneous 6 times per day  . levalbuterol  0.63 mg Nebulization Q6H  . sennosides  5 mL Per Tube BID  . vancomycin  750 mg Intravenous Q12H    Assessment/Plan:  54 YO female S/P cardiac arrest found pulseless by EMS. Possible down time 6 minute or greater. Normothermia protocol completed. Suffered arrest overnight. Repeat EEG shows generalized irregular slowing with resolution of burst suppression pattern. Concern that recent cardiac arrest may have added to any underlying anoxic injury. Guarded prognosis, will need to monitor off sedation. Of note, she does appear to minimally respond to loud voice/noxious stimuli though this is inconsistent.   -sedation wean when medically able -will follow as needed    LOS: 3 days   Elspeth Cho, DO [facility] [phone]  If 7pm- 7am, please page neurology on call as listed in AMION. 12/15/2014  7:35 AM

## 2014-12-15 NOTE — Progress Notes (Signed)
Normothermia protocol completed, Arctic sun pads removed. Ivery Quale, RN

## 2014-12-15 NOTE — Progress Notes (Signed)
ANTIBIOTIC CONSULT NOTE - follow up  Pharmacy Consult for Vancomycin and Unasyn Indication: bacteremia  Allergies  Allergen Reactions  . Hydrocodone Itching  . Erythromycin Rash    Patient Measurements: Height:  (157.5 cm) Weight: 139 lb 1.8 oz (63.1 kg) IBW/kg (Calculated) : 50.1  Vital Signs: Temp: 99.3 F (37.4 C) (09/08 0800) Temp Source: Core (Comment) (09/08 0700) BP: 113/50 mmHg (09/08 0800) Pulse Rate: 107 (09/08 0800) Intake/Output from previous day: 09/07 0701 - 09/08 0700 In: 3690.1 [I.V.:726.8; NG/GT:1133.3; IV Piggyback:1830] Out: 1615 [Urine:1615] Intake/Output from this shift: Total I/O In: 62.3 [I.V.:12.3; NG/GT:50] Out: -   Labs:  Recent Labs  12/13/14 0317 12/13/14 1120 12/14/14 0400 12/15/14 0320  WBC 8.1  --  7.5 7.5  HGB 12.4  --  11.5* 10.1*  PLT 144*  --  137* 123*  CREATININE 0.90 0.81 0.69 0.85   Estimated Creatinine Clearance: 64.5 mL/min (by C-G formula based on Cr of 0.85).  Microbiology: Recent Results (from the past 720 hour(s))  MRSA PCR Screening     Status: None   Collection Time: Jan 10, 2015  2:57 AM  Result Value Ref Range Status   MRSA by PCR NEGATIVE NEGATIVE Final    Comment:        The GeneXpert MRSA Assay (FDA approved for NASAL specimens only), is one component of a comprehensive MRSA colonization surveillance program. It is not intended to diagnose MRSA infection nor to guide or monitor treatment for MRSA infections.   Urine culture     Status: None   Collection Time: 01-10-2015 11:44 AM  Result Value Ref Range Status   Specimen Description URINE, CATHETERIZED  Final   Special Requests NONE  Final   Culture NO GROWTH 1 DAY  Final   Report Status 12/13/2014 FINAL  Final  Culture, respiratory (NON-Expectorated)     Status: None   Collection Time: 10-Jan-2015 12:10 PM  Result Value Ref Range Status   Specimen Description TRACHEAL ASPIRATE  Final   Special Requests Normal  Final   Gram Stain   Final    RARE  WBC PRESENT,BOTH PMN AND MONONUCLEAR NO SQUAMOUS EPITHELIAL CELLS SEEN NO ORGANISMS SEEN Performed at [facility]    Culture   Final    Non-Pathogenic Oropharyngeal-type Flora Isolated. Performed at [facility]    Report Status 12/14/2014 FINAL  Final  Culture, blood (routine x 2)     Status: None (Preliminary result)   Collection Time: 2015-01-10  1:00 PM  Result Value Ref Range Status   Specimen Description BLOOD RIGHT HAND  Final   Special Requests BOTTLES DRAWN AEROBIC ONLY 2CC  Final   Culture NO GROWTH 2 DAYS  Final   Report Status PENDING  Incomplete  Culture, blood (routine x 2)     Status: None (Preliminary result)   Collection Time: 01/10/15  1:15 PM  Result Value Ref Range Status   Specimen Description BLOOD LEFT HAND  Final   Special Requests BOTTLES DRAWN AEROBIC ONLY 1.5CC  Final   Culture  Setup Time   Final    GRAM POSITIVE RODS AEROBIC BOTTLE ONLY CRITICAL RESULT CALLED TO, READ BACK BY AND VERIFIED WITH: P DAVIS,RN AT 1343 12/13/14 BY L BENFIELD    Culture   Final    GRAM NEGATIVE RODS CRITICAL RESULT CALLED TO, READ BACK BY AND VERIFIED WITH: F ABERION,RN AT 1013 12/14/14 BY L BENFIELD GRAM POSITIVE RODS    Report Status PENDING  Incomplete    Medical History:  Past Medical History  Diagnosis Date  . Hypertension   . High cholesterol   . Diabetes mellitus   . Coronary artery disease   . Heart murmur   . CHF (congestive heart failure) 2010  . Bipolar 1 disorder 11/26/2011    "not on medicine"  . Anxiety    Assessment: 53yof s/p cardiac arrest just completed normothermia protocol on unasyn for possible aspiration pneumonia, patient had positive blood cultures and was then started on vancomycin. Renal function improved since admission.  9/5 unasyn>> day 3/x 9/6 vancomycin>> day 2/x  9/5 urine: negative, final 9/5 blood x2: GPR & GNR 1/2 bottles 9/5 resp: oral flora  Goal of Therapy:  Vancomycin trough level 15-20 mcg/ml  Plan:   1) Unasyn 3g IV q6 2) Vancomycin 750mg  IV q12 3) Follow renal function, cultures, LOT, level if needed   Sherron Monday, PharmD Clinical Pharmacy Resident Pager: [phone] 12/15/2014 11:17 AM

## 2014-12-15 NOTE — Care Management Important Message (Signed)
Important Message  Patient Details  Name: [name] MRN: [id number] Date of Birth: [birth year]   Medicare Important Message Given:  Yes-second notification given    Orson Aloe 12/15/2014, 1:30 PM

## 2014-12-15 NOTE — Procedures (Signed)
Chest Tube Insertion Procedure Note  Indications:  Clinically significant Pneumothorax LEFT - suspect as a result of high Pip from mucus plug  Pre-operative Diagnosis: Pneumothorax LEFT   Post-operative Diagnosis: Pneumothorax LEFT  Procedure Details  EMERGENT  OPERATOR  1. Dr Lavinia Sharps 2. PA - Rahul Celine Mans - assist  After sterile skin prep, using standard technique, a 28 French tube was placed in the left anterior 4th rib space.  Findings: AIR CAME GUSHING OUT  Estimated Blood Loss:  NONE         Specimens:  None              Complications:  None; patient tolerated the procedure well.         Disposition: ICU - intubated and critically ill.         Condition: stable  Attending Attestation: I performed the key portions of the  Procedure of local anesthesia,incision, chest tube insertion. APP did sterile drape and stay suture   Dr. Kalman Shan, M.D., [facility]C.P Pulmonary and Critical Care Medicine Staff Physician [facility] [place] [facility] Pager: [phone], If no answer or between  15:00h - 7:00h: call [phone]  12/15/2014 12:08 AM

## 2014-12-15 NOTE — Progress Notes (Addendum)
PULMONARY / CRITICAL CARE MEDICINE   Name: [name] MRN: [id number] DOB: Dec 06, 1960    ADMISSION DATE:  December 18, 2014 CONSULTATION DATE:  2014/12/18  REFERRING MD :  ED  CHIEF COMPLAINT:  Asystolic Arrest  INITIAL PRESENTATION: Out of hospital arrest. Patient called EMS w/ dyspnea. Found pulseless on their arrival. 6 minutes of CPR for ROSC. Suspected aspiration in transit to ED because of [facility] airway.  STUDIES:  EEG 9/5 - burst suppression but also on Propofol. pCXR 9/6 - progressive LLL opacity & persitent RML opacity. CTA Chest 9/6 - No PE. Right mainstem intubation. Atelectasis. Bilateral upper lobe opacities. Mild cardiomegaly. CT Head w/o 9/6 - No acute osseus abnormality. Gray-white matter diff preserved. No hemorrhage or increased ICP. pCXR 9/7 - ETT repositioned appropriately. EEG 9/7 - Generalized nonspecific cerebral dysfunction (encephalopathy). No seizure or burst suppression. pCXR 9/8 - Left chest tube w/ decreased left pneumothorax. Diffuse opacities worse than previous studies.  SIGNIFICANT EVENTS: 9/5 - Intubated in ED 9/5 - Cooled w/ normothermia protocol 9/7 - Bradycardic arrest w/ Mucus plug & Pneumothorax >> s/p bronch & left chest tube placed 9/8 - Completed normothermia protocol  SUBJECTIVE: Patient had mucus plugging overnight that likely precipitated bradycardic cardiac arrest & left side pneumothorax. Bronchoscopy was performed as well as left-sided chest tube placement. Patient is more awake this morning and hemodynamically stable.  ROS:  Unobtainable as patient is intubated & sedated.  VITAL SIGNS: Temp:  [96.3 F (35.7 C)-100.6 F (38.1 C)] 99.3 F (37.4 C) (09/08 0800) Pulse Rate:  [81-126] 107 (09/08 0800) Resp:  [13-23] 15 (09/08 0800) BP: (66-181)/(33-96) 113/50 mmHg (09/08 0800) SpO2:  [86 %-100 %] 99 % (09/08 0800) Arterial Line BP: (48-199)/(34-89) 110/51 mmHg (09/08 0800) FiO2 (%):  [40 %] 40 % (09/08 0728) Weight:  [139 lb 1.8 oz (63.1  kg)] 139 lb 1.8 oz (63.1 kg) (09/08 0416) HEMODYNAMICS:   VENTILATOR SETTINGS: Vent Mode:  [-] PSV;CPAP FiO2 (%):  [40 %] 40 % Set Rate:  [14 bmp] 14 bmp Vt Set:  [420 mL] 420 mL PEEP:  [5 cmH20] 5 cmH20 Pressure Support:  [5 cmH20] 5 cmH20 Plateau Pressure:  [18 cmH20-22 cmH20] 22 cmH20 INTAKE / OUTPUT:  Intake/Output Summary (Last 24 hours) at 12/15/14 0845 Last data filed at 12/15/14 0800  Gross per 24 hour  Intake 3620.91 ml  Output   1615 ml  Net 2005.91 ml    PHYSICAL EXAMINATION: General:  Sedated. No acute distress. Daughter at bedside. Integument:  Warm & dry. No rash on exposed skin. Cooling pads removed. HEENT:  No scleral injection or icterus. Endotracheal tube in place.  Cardiovascular:  Regular rhythm & only minimally tachycardic. Sinus tach on tele. No edema.  Pulmonary:  Coarse breath sounds bilaterally on ventilator. Symmetric chest wall rise. Left chest tube in place to suction. Abdomen: Soft. Normal bowel sounds. Nondistended. Neurological:  PERRL. Cough present. Opens eyes spontaneously. Responds to pain.  LABS:  CBC  Recent Labs Lab 12/13/14 0317 12/14/14 0400 12/15/14 0320  WBC 8.1 7.5 7.5  HGB 12.4 11.5* 10.1*  HCT 36.5 34.9* 30.8*  PLT 144* 137* 123*   Coag's  Recent Labs Lab 12/18/14 0045 12/18/14 0411  APTT 28 26  INR 1.03 1.08   BMET  Recent Labs Lab 12/13/14 1120 12/14/14 0400 12/15/14 0320  NA 139 135 137  K 4.3 4.4 3.8  CL 108 102 103  CO2 24 26 26   BUN 17 14 13   CREATININE 0.81 0.69 0.85  GLUCOSE  105* 99 162*   Electrolytes  Recent Labs Lab 12/13/14 0317 12/13/14 1120 12/14/14 0400 12/15/14 0320  CALCIUM 8.2* 8.5* 8.4* 8.1*  MG 1.8  --  1.8 1.7  PHOS 3.0  --  3.5 3.1   Sepsis Markers  Recent Labs Lab 12/10/2014 0044 12/23/2014 0110  LATICACIDVEN 5.0* 5.05*   ABG  Recent Labs Lab 12/13/14 0400 12/14/14 2133 12/14/14 2300  PHART 7.402 7.214* 7.269*  PCO2ART 39.1 62.7* 49.4*  PO2ART 80.0 406.0*  57.0*   Liver Enzymes  Recent Labs Lab 12/13/14 1120 12/14/14 0400 12/15/14 0320  AST 23 29  --   ALT 22 19  --   ALKPHOS 51 48  --   BILITOT 0.5 0.4  --   ALBUMIN 3.1* 2.7* 2.3*   Cardiac Enzymes  Recent Labs Lab 01/05/2015 2140 12/14/14 2137 12/15/14 0320  TROPONINI 0.18* 2.40* 1.74*   Glucose  Recent Labs Lab 12/14/14 1129 12/14/14 1658 12/14/14 2028 12/15/14 0104 12/15/14 0420 12/15/14 0717  GLUCAP 115* 134* 121* 165* 141* 118*    Imaging Dg Chest Port 1 View  12/15/2014   CLINICAL DATA:  Left-sided chest tube placed.  EXAM: PORTABLE CHEST - 1 VIEW  COMPARISON:  12/14/2014  FINDINGS: Endotracheal tube tip measures 2.2 cm above the carina. Enteric tube tip is off the field of view but below the left hemidiaphragm. Left central venous catheter tip over the cavoatrial junction region. New placement of a left chest tube. The proximal side hole appears to be outside of the rib cage. Left pneumothorax has decreased. Persistent extensive subcutaneous emphysema, pneumomediastinum, and probable pneumoperitoneum. Diffuse airspace disease throughout the lungs. Normal heart size.  IMPRESSION: Left chest tube placed with proximal side hole appearing to be outside of the ribcage. Decreased left pneumothorax. Extensive subcutaneous emphysema, pneumomediastinum and probable pneumoperitoneum. Diffuse bilateral pulmonary infiltrates.  These results were called by telephone at the time of interpretation on 12/15/2014 at 1:12 am to [facility], who verbally acknowledged these results.   Electronically Signed   By: Burman Nieves M.D.   On: 12/15/2014 01:14   Dg Chest Port 1v Same Day  12/14/2014   CLINICAL DATA:  Post CPR.  Oxygen decrease.  EXAM: PORTABLE CHEST - 1 VIEW SAME DAY  COMPARISON:  12/14/2014  FINDINGS: Endotracheal tube is present with tip measuring 2.2 cm above the carina. Enteric tube is present with tip off the field of view but below the left hemidiaphragm. Left central venous  catheter with tip over the low SVC. Appliances are unchanged in position since previous study. Shallow inspiration. There is atelectasis or infiltration in the lung bases with mild perihilar infiltration bilaterally. Since the previous study, there is interval development of severe subcutaneous emphysema throughout the left chest wall, focally in the right lateral chest wall, and in the neck base bilaterally. Mild pneumo mediastinum. Probable free air under the right hemidiaphragm. Lucency at the left costophrenic angle suspicious for left pneumothorax. Probable left anterior rib fractures.  IMPRESSION: Appliances appear in satisfactory location. Shallow inspiration with basilar infiltration or atelectasis. Bilateral perihilar infiltration. Diffuse soft tissue emphysema throughout the left chest, base of neck, and right lateral chest. Pneumo mediastinum. A left pneumothorax. Probable free air under the right hemidiaphragm.  These results were called by telephone at the time of interpretation on 12/14/2014 at 11:07 pm to RN Isaiah Serge, who verbally acknowledged these results.   Electronically Signed   By: Burman Nieves M.D.   On: 12/14/2014 23:09   Dg Abd Portable 1v  12/15/2014   CLINICAL DATA:  OG tube placement.  EXAM: PORTABLE ABDOMEN - 1 VIEW  COMPARISON:  Chest 12/15/2014  FINDINGS: Subcutaneous emphysema along the lateral left abdominal wall. Enteric tube with tip in the left upper quadrant consistent with location in the body of the stomach. No evidence of bowel distention.  IMPRESSION: Enteric tube tip localizes to the left upper quadrant consistent with location in the body of the stomach. Soft tissue emphysema in the abdominal wall.   Electronically Signed   By: Burman Nieves M.D.   On: 12/15/2014 04:31     ASSESSMENT / PLAN:  PULMONARY OETT 7.5 9/5>> A: Acute Hypoxic Respiratory Failure Left Pneumothorax - s/p chest tube Respiratory Arrest Probable aspiration H/O COPD Ongoing tobacco  use  P:   Left Chest Tube to continuous wall suction Repeat ABG No PE on CTA chest Xopenex nebs every 6 hours Vent bundle Wean FiO2 for Sat >94% See ID for aspiration  CARDIOVASCULAR L [place] CVL 9/5>> A:  Asystolic Arrest w/ repeat bradycardic arrest overnight Hypertension Sinus Tachycardia  P:  Cardiology consulted & following peripherally Completed normothermia protocol TTE w/o any acute change Labetalol IV prn Holding Lopressor given hypotension  RENAL A:   Hypokalemia - resolved  P:   Monitor electrolytes daily Monitor renal function w/ daily BUN/Creatinine Monitor UOP with foley catheter  GASTROINTESTINAL A:   No acute issues No BM on Tube Feeds  P:   TF per dietary recommendations Pepcid IV q12hr Colace & Senna via tube bid  HEMATOLOGIC A:   Anemia - no signs of active bleeding Thrombocytopenia - stable  P:  SCDs Heparin [place] q8hr Monitor Hgb daily w/ CBC Monitor Platelets daily w/ CBC  INFECTIOUS A:   Aspiration pneumonitis vs pneumonia Possible Bacteremia - likely contaminate  P:   Procalcitonin algorithm Checking Urine Legionella & Strep antigens Monitor for leukocytosis with daily CBC  BCx2 9/5>>GPR & GNR 1/2 bottles UC 9/5>>negative Sputum 9/5>>Oral Flora  Abx:  Unasyn, start date 9/5, day 3/x Vancomycin, start date 9/6, day 2/x  ENDOCRINE A:   H/O DM    P:   Accuchecks q4hr SSI coverage per algorithm  NEUROLOGIC A:   Possible Anoxic Brain Injury Burst Suppression on EEG - resolved on repeat EEG  P:   RASS goal: 0 Propofol gtt Fentanyl gtt Ativan IV prn Repeat EEG w/o burst suppression Neuro following CT head negative  FAMILY  - Updates: Daughter updated at bedside.   - Inter-disciplinary family meet or Palliative Care meeting due by:  9/12   TODAY'S SUMMARY: 54 year old female with reported history of COPD & ongoing tobacco use. Patient contacted EMS for dyspnea and on arrival was found to be in asystole.  Arrest likely precipitated by respiratory event. Repeat arrest and spontaneous left pneumothorax after mucus plugging. Plan to rest the patient today on full ventilator support & attempt SBT with sedation vacation in the morning.  I have spent a total of 40 minutes of critical care time today caring for the patient, reviewing the patient's electronic medical record, & updating the patient's family at bedside.  Donna Christen Jamison Neighbor, M.D. [facility] [place] [facility] Pager:  [phone] After 3pm or if no response, call [phone] 12/15/2014, 8:45 AM

## 2014-12-16 ENCOUNTER — Inpatient Hospital Stay (HOSPITAL_COMMUNITY): Payer: Medicare HMO

## 2014-12-16 DIAGNOSIS — D649 Anemia, unspecified: Secondary | ICD-10-CM

## 2014-12-16 LAB — RENAL FUNCTION PANEL
ALBUMIN: 1.9 g/dL — AB (ref 3.5–5.0)
Anion gap: 7 (ref 5–15)
BUN: 16 mg/dL (ref 6–20)
CALCIUM: 8.3 mg/dL — AB (ref 8.9–10.3)
CO2: 29 mmol/L (ref 22–32)
Chloride: 102 mmol/L (ref 101–111)
Creatinine, Ser: 0.89 mg/dL (ref 0.44–1.00)
GFR calc Af Amer: >60 mL/min (ref >60)
GFR calc non Af Amer: >60 mL/min (ref >60)
GLUCOSE: 161 mg/dL — AB (ref 65–99)
PHOSPHORUS: 2.9 mg/dL (ref 2.5–4.6)
POTASSIUM: 3.9 mmol/L (ref 3.5–5.1)
SODIUM: 138 mmol/L (ref 135–145)

## 2014-12-16 LAB — GLUCOSE, CAPILLARY
GLUCOSE-CAPILLARY: 125 mg/dL — AB (ref 65–99)
GLUCOSE-CAPILLARY: 141 mg/dL — AB (ref 65–99)
GLUCOSE-CAPILLARY: 157 mg/dL — AB (ref 65–99)
GLUCOSE-CAPILLARY: 158 mg/dL — AB (ref 65–99)
GLUCOSE-CAPILLARY: 161 mg/dL — AB (ref 65–99)
GLUCOSE-CAPILLARY: 167 mg/dL — AB (ref 65–99)
Glucose-Capillary: 150 mg/dL — ABNORMAL HIGH (ref 65–99)

## 2014-12-16 LAB — PROCALCITONIN: Procalcitonin: 1.84 ng/mL

## 2014-12-16 LAB — TRIGLYCERIDES: Triglycerides: 157 mg/dL — ABNORMAL HIGH (ref <150)

## 2014-12-16 LAB — CBC WITH DIFFERENTIAL/PLATELET
BASOS ABS: 0 10*3/uL (ref 0.0–0.1)
BASOS PCT: 0 % (ref 0–1)
EOS PCT: 2 % (ref 0–5)
Eosinophils Absolute: 0.1 10*3/uL (ref 0.0–0.7)
HCT: 27.4 % — ABNORMAL LOW (ref 36.0–46.0)
Hemoglobin: 9.1 g/dL — ABNORMAL LOW (ref 12.0–15.0)
Lymphocytes Relative: 13 % (ref 12–46)
Lymphs Abs: 0.6 10*3/uL — ABNORMAL LOW (ref 0.7–4.0)
MCH: 31.4 pg (ref 26.0–34.0)
MCHC: 33.2 g/dL (ref 30.0–36.0)
MCV: 94.5 fL (ref 78.0–100.0)
MONO ABS: 0.6 10*3/uL (ref 0.1–1.0)
Monocytes Relative: 14 % — ABNORMAL HIGH (ref 3–12)
Neutro Abs: 3.2 10*3/uL (ref 1.7–7.7)
Neutrophils Relative %: 71 % (ref 43–77)
PLATELETS: 128 10*3/uL — AB (ref 150–400)
RBC: 2.9 MIL/uL — ABNORMAL LOW (ref 3.87–5.11)
RDW: 13.4 % (ref 11.5–15.5)
WBC: 4.6 10*3/uL (ref 4.0–10.5)

## 2014-12-16 LAB — LEGIONELLA ANTIGEN, URINE

## 2014-12-16 LAB — MAGNESIUM: Magnesium: 1.8 mg/dL (ref 1.7–2.4)

## 2014-12-16 LAB — HEMOGLOBIN AND HEMATOCRIT, BLOOD
HEMATOCRIT: 26.4 % — AB (ref 36.0–46.0)
Hemoglobin: 8.5 g/dL — ABNORMAL LOW (ref 12.0–15.0)

## 2014-12-16 MED ORDER — ACETYLCYSTEINE 20 % IN SOLN: 3.0000 mL | Freq: Four times a day (QID) | RESPIRATORY_TRACT | Status: DC

## 2014-12-16 MED ORDER — ACETYLCYSTEINE 20 % IN SOLN
3.0000 mL | Freq: Four times a day (QID) | RESPIRATORY_TRACT | Status: DC
  Administered 2014-12-16 – 2014-12-18 (×7): 3 mL via RESPIRATORY_TRACT
  Filled 2014-12-16 (×12): qty 4

## 2014-12-16 NOTE — Progress Notes (Signed)
PULMONARY / CRITICAL CARE MEDICINE   Name: [name] MRN: [id number] DOB: [birth year]    ADMISSION DATE:  01/03/2015 CONSULTATION DATE:  12/22/2014  REFERRING MD :  ED  CHIEF COMPLAINT:  Asystolic Arrest  INITIAL PRESENTATION: Out of hospital arrest. Patient called EMS w/ dyspnea. Found pulseless on their arrival. 6 minutes of CPR for ROSC. Suspected aspiration in transit to ED because of [facility] airway.  STUDIES:  EEG 9/5 - burst suppression but also on Propofol. pCXR 9/6 - progressive LLL opacity & persitent RML opacity. CTA Chest 9/6 - No PE. Right mainstem intubation. Atelectasis. Bilateral upper lobe opacities. Mild cardiomegaly. CT Head w/o 9/6 - No acute osseus abnormality. Gray-white matter diff preserved. No hemorrhage or increased ICP. pCXR 9/7 - ETT repositioned appropriately. EEG 9/7 - Generalized nonspecific cerebral dysfunction (encephalopathy). No seizure or burst suppression. pCXR 9/8 - Left chest tube w/ decreased left pneumothorax. Diffuse opacities worse than previous studies.  SIGNIFICANT EVENTS: 9/5 - Intubated in ED 9/5 - Cooled w/ normothermia protocol 9/7 - Bradycardic arrest w/ Mucus plug & Pneumothorax >> s/p bronch & left chest tube placed 9/8 - Completed normothermia protocol  SUBJECTIVE: No acute events overnight. Hemoglobin continues to trend downward without obvious signs of active bleeding. Sedation on hold today but patient not following commands.  ROS:  Unobtainable as patient is intubated & with altered mentation.  VITAL SIGNS: Temp:  [99.1 F (37.3 C)-100.9 F (38.3 C)] 100.4 F (38 C) (09/09 1700) Pulse Rate:  [90-123] 110 (09/09 1701) Resp:  [13-20] 17 (09/09 1701) BP: (104-192)/(46-84) 146/73 mmHg (09/09 1700) SpO2:  [96 %-100 %] 100 % (09/09 1701) Arterial Line BP: (134-152)/(64-81) 152/81 mmHg (09/09 1200) FiO2 (%):  [40 %] 40 % (09/09 1701) Weight:  [142 lb 3.2 oz (64.5 kg)] 142 lb 3.2 oz (64.5 kg) (09/09 0400) HEMODYNAMICS:    VENTILATOR SETTINGS: Vent Mode:  [-] PRVC FiO2 (%):  [40 %] 40 % Set Rate:  [14 bmp] 14 bmp Vt Set:  [350 mL] 350 mL PEEP:  [5 cmH20] 5 cmH20 Pressure Support:  [5 cmH20] 5 cmH20 INTAKE / OUTPUT:  Intake/Output Summary (Last 24 hours) at 12/16/14 1721 Last data filed at 12/16/14 1600  Gross per 24 hour  Intake 2222.5 ml  Output   1253 ml  Net  969.5 ml    PHYSICAL EXAMINATION: General:  No acute distress. Eyes open. Daughter again at bedside. Integument:  Warm & dry. No rash on exposed skin.  HEENT:  No scleral injection or icterus. Endotracheal tube in place.  Cardiovascular:  Regular rhythm. No edema. Unable to appreciate JVD. Pulmonary:  Coarse breath sounds bilaterally on ventilator. Symmetric chest wall rise. Left chest tube in place to suction. Abdomen: Soft. Normal bowel sounds. Nondistended. Neurological:  Patient opens eyes spontaneously. Does not attend. Intermittent myoclonic jerk. Not following commands.  LABS:  CBC  Recent Labs Lab 12/14/14 0400 12/15/14 0320 12/16/14 0420 12/16/14 1315  WBC 7.5 7.5 4.6  --   HGB 11.5* 10.1* 9.1* 8.5*  HCT 34.9* 30.8* 27.4* 26.4*  PLT 137* 123* 128*  --    Coag's  Recent Labs Lab 01/01/2015 0045 01/04/2015 0411  APTT 28 26  INR 1.03 1.08   BMET  Recent Labs Lab 12/14/14 0400 12/15/14 0320 12/16/14 0420  NA 135 137 138  K 4.4 3.8 3.9  CL 102 103 102  CO2 26 26 29   BUN 14 13 16   CREATININE 0.69 0.85 0.89  GLUCOSE 99 162* 161*   Electrolytes  Recent Labs Lab 12/14/14 0400 12/15/14 0320 12/16/14 0420  CALCIUM 8.4* 8.1* 8.3*  MG 1.8 1.7 1.8  PHOS 3.5 3.1 2.9   Sepsis Markers  Recent Labs Lab 01/04/2015 0044 01/01/2015 0110 12/15/14 0915 12/16/14 0420  LATICACIDVEN 5.0* 5.05*  --   --   PROCALCITON  --   --  2.32 1.84   ABG  Recent Labs Lab 12/14/14 2133 12/14/14 2300 12/15/14 0935  PHART 7.214* 7.269* 7.326*  PCO2ART 62.7* 49.4* 48.6*  PO2ART 406.0* 57.0* 101*   Liver  Enzymes  Recent Labs Lab 12/13/14 1120 12/14/14 0400 12/15/14 0320 12/16/14 0420  AST 23 29  --   --   ALT 22 19  --   --   ALKPHOS 51 48  --   --   BILITOT 0.5 0.4  --   --   ALBUMIN 3.1* 2.7* 2.3* 1.9*   Cardiac Enzymes  Recent Labs Lab 12/14/14 2137 12/15/14 0320 12/15/14 0936  TROPONINI 2.40* 1.74* 1.72*   Glucose  Recent Labs Lab 12/15/14 2040 12/16/14 0007 12/16/14 0422 12/16/14 0731 12/16/14 1305 12/16/14 1704  GLUCAP 152* 161* 167* 150* 125* 158*    Imaging Ct Abdomen Pelvis Wo Contrast  12/16/2014   CLINICAL DATA:  Anemia, blood loss of uncertain etiology. Hemoglobin 2.90 today. Fever. Out of hospital cardiac arrest 12/15/2014 with a subsequent arrest in the hospital.  EXAM: CT CHEST, ABDOMEN AND PELVIS WITHOUT CONTRAST  TECHNIQUE: Multidetector CT imaging of the chest, abdomen and pelvis was performed following the standard protocol without IV contrast.  COMPARISON:  CT chest 12/13/2014.  FINDINGS: CT CHEST FINDINGS  There small bilateral pleural effusions and a small pericardial effusion. Marked cardiomegaly is seen. Calcific aortic and coronary atherosclerosis is identified. Fracture of the lateral arc of the right third rib is identified and was present on the prior CT. It is better visualized today. No other fracture is noted.  The patient has small bilateral pneumothoraces. Patchy bilateral airspace disease is seen in all lobes of the lungs with areas of ground-glass attenuation. No nodule or mass is identified.  CT ABDOMEN AND PELVIS FINDINGS  Massive subcutaneous gas is present about the abdomen and there is a large volume of free intraperitoneal air. Retroperitoneal air is also seen. The stomach and small and large bowel are unremarkable in appearance. Source of the air is not identified but is thought to be related to the patient's chest tube. There is no intra-abdominal fluid. No hemorrhage is identified.  The liver, spleen, adrenal glands, pancreas and  kidneys appear normal. There is extensive aortoiliac atherosclerosis without aneurysm. The gallbladder is somewhat distended but otherwise unremarkable.  No bony abnormality is seen.  IMPRESSION: Massive volume of intra-abdominal free air and subcutaneous air within the abdomen sphere is likely related to the patient's chest tube. Bowel perforation cannot be completely excluded but is thought highly unlikely.  Small bilateral pneumothoraces.  New patchy bilateral airspace disease and ground-glass attenuation could be due to pneumonia or possibly pulmonary hemorrhage. The extent of this abnormality would not account for the patient's hemoglobin decrease.  Small bilateral pleural effusions and pericardial effusion.  Nondisplaced right third rib fracture.  Marked cardiomegaly.  Calcific aortic and coronary atherosclerosis.  Critical Value/emergent results were called by telephone at the time of interpretation on 12/16/2014 at 1:10 pm to the patient's nurse, who verbally acknowledged these results.   Electronically Signed   By: Drusilla Kanner M.D.   On: 12/16/2014 13:15   Ct Head Wo Contrast  12/16/2014   CLINICAL DATA:  Altered mental status post arrest  EXAM: CT HEAD WITHOUT CONTRAST  TECHNIQUE: Contiguous axial images were obtained from the base of the skull through the vertex without intravenous contrast.  COMPARISON:  12/13/2014  FINDINGS: Images are motion degraded. Mild sinusitis predominantly in the sphenoid sinuses. No acute hemorrhage, infarct, or mass lesion is identified. No midline shift. Subjectively, there is minimal relative hyperdensity and loss of sulcation involving the cerebellum but this finding is stable compared to previously and is felt to most likely be technical. No midline shift. No ventriculomegaly. Orbits are unremarkable. No skull fracture.  IMPRESSION: No evidence for acute intracranial finding.   Electronically Signed   By: Christiana Pellant M.D.   On: 12/16/2014 12:56   Ct Chest Wo  Contrast  12/16/2014   CLINICAL DATA:  Anemia, blood loss of uncertain etiology. Hemoglobin 2.90 today. Fever. Out of hospital cardiac arrest 12/19/2014 with a subsequent arrest in the hospital.  EXAM: CT CHEST, ABDOMEN AND PELVIS WITHOUT CONTRAST  TECHNIQUE: Multidetector CT imaging of the chest, abdomen and pelvis was performed following the standard protocol without IV contrast.  COMPARISON:  CT chest 12/13/2014.  FINDINGS: CT CHEST FINDINGS  There small bilateral pleural effusions and a small pericardial effusion. Marked cardiomegaly is seen. Calcific aortic and coronary atherosclerosis is identified. Fracture of the lateral arc of the right third rib is identified and was present on the prior CT. It is better visualized today. No other fracture is noted.  The patient has small bilateral pneumothoraces. Patchy bilateral airspace disease is seen in all lobes of the lungs with areas of ground-glass attenuation. No nodule or mass is identified.  CT ABDOMEN AND PELVIS FINDINGS  Massive subcutaneous gas is present about the abdomen and there is a large volume of free intraperitoneal air. Retroperitoneal air is also seen. The stomach and small and large bowel are unremarkable in appearance. Source of the air is not identified but is thought to be related to the patient's chest tube. There is no intra-abdominal fluid. No hemorrhage is identified.  The liver, spleen, adrenal glands, pancreas and kidneys appear normal. There is extensive aortoiliac atherosclerosis without aneurysm. The gallbladder is somewhat distended but otherwise unremarkable.  No bony abnormality is seen.  IMPRESSION: Massive volume of intra-abdominal free air and subcutaneous air within the abdomen sphere is likely related to the patient's chest tube. Bowel perforation cannot be completely excluded but is thought highly unlikely.  Small bilateral pneumothoraces.  New patchy bilateral airspace disease and ground-glass attenuation could be due to  pneumonia or possibly pulmonary hemorrhage. The extent of this abnormality would not account for the patient's hemoglobin decrease.  Small bilateral pleural effusions and pericardial effusion.  Nondisplaced right third rib fracture.  Marked cardiomegaly.  Calcific aortic and coronary atherosclerosis.  Critical Value/emergent results were called by telephone at the time of interpretation on 12/16/2014 at 1:10 pm to the patient's nurse, who verbally acknowledged these results.   Electronically Signed   By: Drusilla Kanner M.D.   On: 12/16/2014 13:15   Dg Chest Port 1 View  12/16/2014   CLINICAL DATA:  Pneumothorax.  EXAM: PORTABLE CHEST - 1 VIEW  COMPARISON:  12/15/2014.  12/14/2014.  FINDINGS: Endotracheal tube, NG tube, left subclavian line in good anatomic position. Left chest tube in stable position. Chest tube side port is in the chest wall in unchanged position. Stable cardiomegaly. Diffuse bilateral pulmonary infiltrates. Left-sided small pneumothorax is not definitely identified on today's examination. No pleural  effusion. Diffuse subcutaneous emphysema is again noted. Again free air under the right hemidiaphragm cannot be excluded.  IMPRESSION: 1. Lines and tubes in stable position. Left chest tube side-port is again noted in the left chest wall in unchanged position. Previously identified left pneumothorax is not definitely identified on today's exam. 2. Diffuse bilateral pulmonary infiltrates again noted. 3. Stable cardiomegaly. 4. Diffuse chest wall subcutaneous emphysema, improved from prior exams. 5. Free air under the right hemidiaphragm again cannot be excluded. These findings have been previously discussed with the patient's caregiver.   Electronically Signed   By: Maisie Fus  Register   On: 12/16/2014 07:17     ASSESSMENT / PLAN:  PULMONARY OETT 7.5 9/5>> A: Acute Hypoxic Respiratory Failure Left Pneumothorax - s/p chest tube Respiratory Arrest Probable aspiration H/O COPD Ongoing tobacco  use  P:   Left Chest Tube to continuous wall suction Repeat CT scan of the chest without contrast to evaluate pneumothorax No PE on CTA chest Xopenex nebs every 6 hours Vent bundle Wean FiO2 for Sat >94% See ID for aspiration  CARDIOVASCULAR L [place] CVL 9/5>> A:  Asystolic Arrest w/ repeat bradycardic arrest overnight 9/7 Hypertension Sinus Tachycardia  P:  Cardiology consulted & following peripherally Completed normothermia protocol TTE w/o any acute change Labetalol IV prn  RENAL A:   Hypokalemia - resolved  P:   Monitor electrolytes daily Monitor renal function w/ daily BUN/Creatinine Monitor UOP with foley catheter  GASTROINTESTINAL A:   No acute issues No BM on Tube Feeds  P:   TF per dietary recommendations Pepcid IV q12hr Colace & Senna via tube bid  HEMATOLOGIC A:   Anemia - no signs of active bleeding. Worsening. Thrombocytopenia - stable  P:  SCDs Hold heparin Repeat hemoglobin/hematocrit at 1400 hrs. today Monitor Hgb daily w/ CBC Monitor Platelets daily w/ CBC Checking CT scan of chest, abdomen, & pelvis for source of bleeding.  INFECTIOUS A:   Aspiration pneumonitis vs pneumonia Possible Bacteremia - question contaminant given multiple organisms  P:   Monitor for leukocytosis with daily CBC  BCx2 9/5>>1/2 bottles positive sensitive to Unasyn UC 9/5>>negative Sputum 9/5>>Oral Flora  Urine streptococcal antigen 9/8>> Negative Urine Legionella antigen 9/8>> Negative  Abx:  Unasyn, start date 9/5, day 5/x Vancomycin 9/6>>9/9  ENDOCRINE A:   H/O DM    P:   Accuchecks q4hr SSI coverage per algorithm  NEUROLOGIC A:   Possible Anoxic Brain Injury Burst Suppression on EEG - resolved on repeat EEG  P:   Sedation vacation today RASS goal: 0 Propofol gtt on hold Fentanyl gtt Ativan IV prn Repeat EEG w/o burst suppression Neuro following Repeat CT head without contrast today.  FAMILY  - Updates: Daughter updated at bedside  and son updated via phone.  - Inter-disciplinary family meet or Palliative Care meeting due by:  9/12   TODAY'S SUMMARY: 54 year old female with reported history of COPD & ongoing tobacco use. Patient contacted EMS for dyspnea and on arrival was found to be in asystole. Arrest likely precipitated by respiratory event. Repeat arrest and spontaneous left pneumothorax after mucus plugging. Patient has worsening anemia. Unclear bleeding source if any. She does appear to be having intermittent myoclonus. No purposeful movements today and guarded prognosis for neurologic recovery.  I have spent a total of 41 minutes of critical care time today caring for the patient, reviewing the patient's electronic medical record, & updating the patient's family today.  Donna Christen Jamison Neighbor, M.D. [facility] Pulmonary & Critical Care Pager:  [phone] After 3pm or if no response, call [phone] 12/16/2014, 5:21 PM

## 2014-12-16 NOTE — Progress Notes (Signed)
Left radial arterial line discontinued per Dr Jamison Neighbor per protocol r/t dampened waveform. Pt with no signs of bleeding s/p removal. Will continue to monitor.

## 2014-12-17 ENCOUNTER — Inpatient Hospital Stay (HOSPITAL_COMMUNITY): Payer: Medicare HMO

## 2014-12-17 DIAGNOSIS — Z66 Do not resuscitate: Secondary | ICD-10-CM

## 2014-12-17 LAB — CBC WITH DIFFERENTIAL/PLATELET
BASOS ABS: 0 10*3/uL (ref 0.0–0.1)
BASOS PCT: 0 % (ref 0–1)
EOS PCT: 3 % (ref 0–5)
Eosinophils Absolute: 0.1 10*3/uL (ref 0.0–0.7)
HEMATOCRIT: 26.6 % — AB (ref 36.0–46.0)
Hemoglobin: 8.7 g/dL — ABNORMAL LOW (ref 12.0–15.0)
Lymphocytes Relative: 16 % (ref 12–46)
Lymphs Abs: 0.8 10*3/uL (ref 0.7–4.0)
MCH: 31.3 pg (ref 26.0–34.0)
MCHC: 32.7 g/dL (ref 30.0–36.0)
MCV: 95.7 fL (ref 78.0–100.0)
MONO ABS: 0.8 10*3/uL (ref 0.1–1.0)
Monocytes Relative: 17 % — ABNORMAL HIGH (ref 3–12)
NEUTROS ABS: 3.2 10*3/uL (ref 1.7–7.7)
Neutrophils Relative %: 64 % (ref 43–77)
PLATELETS: 138 10*3/uL — AB (ref 150–400)
RBC: 2.78 MIL/uL — AB (ref 3.87–5.11)
RDW: 13.4 % (ref 11.5–15.5)
WBC: 5 10*3/uL (ref 4.0–10.5)

## 2014-12-17 LAB — PROCALCITONIN: PROCALCITONIN: 5.9 ng/mL

## 2014-12-17 LAB — GLUCOSE, CAPILLARY
GLUCOSE-CAPILLARY: 131 mg/dL — AB (ref 65–99)
GLUCOSE-CAPILLARY: 117 mg/dL — AB (ref 65–99)
GLUCOSE-CAPILLARY: 130 mg/dL — AB (ref 65–99)
Glucose-Capillary: 122 mg/dL — ABNORMAL HIGH (ref 65–99)
Glucose-Capillary: 115 mg/dL — ABNORMAL HIGH (ref 65–99)

## 2014-12-17 LAB — RENAL FUNCTION PANEL
Albumin: 1.8 g/dL — ABNORMAL LOW (ref 3.5–5.0)
Anion gap: 6 (ref 5–15)
BUN: 17 mg/dL (ref 6–20)
CALCIUM: 8.5 mg/dL — AB (ref 8.9–10.3)
CO2: 31 mmol/L (ref 22–32)
CREATININE: 0.97 mg/dL (ref 0.44–1.00)
Chloride: 102 mmol/L (ref 101–111)
GFR calc Af Amer: >60 mL/min (ref >60)
GFR calc non Af Amer: >60 mL/min (ref >60)
GLUCOSE: 124 mg/dL — AB (ref 65–99)
Phosphorus: 2.8 mg/dL (ref 2.5–4.6)
Potassium: 3.6 mmol/L (ref 3.5–5.1)
SODIUM: 139 mmol/L (ref 135–145)

## 2014-12-17 LAB — CULTURE, BLOOD (ROUTINE X 2): Culture: NO GROWTH

## 2014-12-17 LAB — TRIGLYCERIDES: Triglycerides: 177 mg/dL — ABNORMAL HIGH (ref <150)

## 2014-12-17 LAB — MAGNESIUM: Magnesium: 1.9 mg/dL (ref 1.7–2.4)

## 2014-12-17 MED ORDER — HEPARIN SODIUM (PORCINE) 5000 UNIT/ML IJ SOLN
5000.0000 [IU] | Freq: Three times a day (TID) | INTRAMUSCULAR | Status: DC
  Administered 2014-12-17 – 2014-12-19 (×7): 5000 [IU] via SUBCUTANEOUS
  Filled 2014-12-17 (×7): qty 1

## 2014-12-17 MED ORDER — PANTOPRAZOLE SODIUM 40 MG PO PACK
40.0000 mg | PACK | ORAL | Status: DC
  Administered 2014-12-17 – 2014-12-18 (×2): 40 mg
  Filled 2014-12-17 (×5): qty 20

## 2014-12-17 NOTE — Progress Notes (Signed)
EEG completed, results pending. 

## 2014-12-17 NOTE — Progress Notes (Signed)
PULMONARY / CRITICAL CARE MEDICINE   Name: [name] MRN: [id number] DOB: [birth year]    ADMISSION DATE:  23-Dec-2014 CONSULTATION DATE:  23-Dec-2014  REFERRING MD :  ED  CHIEF COMPLAINT:  Asystolic Arrest  INITIAL PRESENTATION: Out of hospital arrest. Patient called EMS w/ dyspnea. Found pulseless on their arrival. 6 minutes of CPR for ROSC. Suspected aspiration in transit to ED because of [facility] airway.  STUDIES:  EEG 9/5 - burst suppression but also on Propofol. CTA Chest 9/6 - No PE. Right mainstem intubation. Atelectasis. Bilateral upper lobe opacities. Mild cardiomegaly. CT Head w/o 9/6 - No acute osseus abnormality. Gray-white matter diff preserved. No hemorrhage or increased ICP. Echo 9/6 - severe LVH, hypertrophic CM, grade 1 diastolic dysfx EEG 9/7 - Generalized nonspecific cerebral dysfunction (encephalopathy). No seizure or burst suppression.  SIGNIFICANT EVENTS: 9/5 - Intubated in ED 9/5 - Cooled w/ normothermia protocol 9/7 - Bradycardic arrest w/ Mucus plug & Pneumothorax >> s/p bronch & left chest tube placed 9/8 - Completed normothermia protocol; myoclonus noted  SUBJECTIVE:  Remains on propofol.  RN notes frequent myoclonic activity with stimulation.  VITAL SIGNS: Temp:  [99.1 F (37.3 C)-100.9 F (38.3 C)] 99.7 F (37.6 C) (09/10 0600) Pulse Rate:  [98-123] 107 (09/10 0600) Resp:  [12-20] 14 (09/10 0600) BP: (104-192)/(47-84) 143/60 mmHg (09/10 0600) SpO2:  [96 %-100 %] 98 % (09/10 0600) Arterial Line BP: (134-152)/(64-81) 152/81 mmHg (09/09 1200) FiO2 (%):  [40 %] 40 % (09/10 0319) Weight:  [139 lb 15.9 oz (63.5 kg)] 139 lb 15.9 oz (63.5 kg) (09/10 0319) VENTILATOR SETTINGS: Vent Mode:  [-] PRVC FiO2 (%):  [40 %] 40 % Set Rate:  [14 bmp] 14 bmp Vt Set:  [350 mL] 350 mL PEEP:  [5 cmH20] 5 cmH20 Pressure Support:  [5 cmH20] 5 cmH20 Plateau Pressure:  [4 cmH20-20 cmH20] 20 cmH20 INTAKE / OUTPUT:  Intake/Output Summary (Last 24 hours) at 12/17/14  4098 Last data filed at 12/17/14 0606  Gross per 24 hour  Intake 2311.61 ml  Output   1525 ml  Net 786.61 ml    PHYSICAL EXAMINATION: General: on propofol Integument: no rashes HEENT: pupils pinpoint, weakly reactive Cardiovascular: regular Pulmonary: no wheeze, no air leak Abdomen: Soft. Normal bowel sounds. Nondistended. Neurologic: grimaces with stimulation with episodes of myoclonic jerks  LABS:  CBC  Recent Labs Lab 12/15/14 0320 12/16/14 0420 12/16/14 1315 12/17/14 0205  WBC 7.5 4.6  --  5.0  HGB 10.1* 9.1* 8.5* 8.7*  HCT 30.8* 27.4* 26.4* 26.6*  PLT 123* 128*  --  138*   Coag's  Recent Labs Lab Dec 23, 2014 0045 23-Dec-2014 0411  APTT 28 26  INR 1.03 1.08   BMET  Recent Labs Lab 12/15/14 0320 12/16/14 0420 12/17/14 0205  NA 137 138 139  K 3.8 3.9 3.6  CL 103 102 102  CO2 26 29 31   BUN 13 16 17   CREATININE 0.85 0.89 0.97  GLUCOSE 162* 161* 124*   Electrolytes  Recent Labs Lab 12/15/14 0320 12/16/14 0420 12/17/14 0205  CALCIUM 8.1* 8.3* 8.5*  MG 1.7 1.8 1.9  PHOS 3.1 2.9 2.8   Sepsis Markers  Recent Labs Lab 2014/12/23 0044 December 23, 2014 0110 12/15/14 0915 12/16/14 0420 12/17/14 0205  LATICACIDVEN 5.0* 5.05*  --   --   --   PROCALCITON  --   --  2.32 1.84 5.90   ABG  Recent Labs Lab 12/14/14 2133 12/14/14 2300 12/15/14 0935  PHART 7.214* 7.269* 7.326*  PCO2ART 62.7* 49.4*  48.6*  PO2ART 406.0* 57.0* 101*   Liver Enzymes  Recent Labs Lab 12/13/14 1120 12/14/14 0400 12/15/14 0320 12/16/14 0420 12/17/14 0205  AST 23 29  --   --   --   ALT 22 19  --   --   --   ALKPHOS 51 48  --   --   --   BILITOT 0.5 0.4  --   --   --   ALBUMIN 3.1* 2.7* 2.3* 1.9* 1.8*   Cardiac Enzymes  Recent Labs Lab 12/14/14 2137 12/15/14 0320 12/15/14 0936  TROPONINI 2.40* 1.74* 1.72*   Glucose  Recent Labs Lab 12/16/14 0731 12/16/14 1305 12/16/14 1704 12/16/14 2015 12/16/14 2343 12/17/14 0426  GLUCAP 150* 125* 158* 141* 157* 122*     Imaging Ct Abdomen Pelvis Wo Contrast  12/16/2014   CLINICAL DATA:  Anemia, blood loss of uncertain etiology. Hemoglobin 2.90 today. Fever. Out of hospital cardiac arrest Jan 03, 2015 with a subsequent arrest in the hospital.  EXAM: CT CHEST, ABDOMEN AND PELVIS WITHOUT CONTRAST  TECHNIQUE: Multidetector CT imaging of the chest, abdomen and pelvis was performed following the standard protocol without IV contrast.  COMPARISON:  CT chest 12/13/2014.  FINDINGS: CT CHEST FINDINGS  There small bilateral pleural effusions and a small pericardial effusion. Marked cardiomegaly is seen. Calcific aortic and coronary atherosclerosis is identified. Fracture of the lateral arc of the right third rib is identified and was present on the prior CT. It is better visualized today. No other fracture is noted.  The patient has small bilateral pneumothoraces. Patchy bilateral airspace disease is seen in all lobes of the lungs with areas of ground-glass attenuation. No nodule or mass is identified.  CT ABDOMEN AND PELVIS FINDINGS  Massive subcutaneous gas is present about the abdomen and there is a large volume of free intraperitoneal air. Retroperitoneal air is also seen. The stomach and small and large bowel are unremarkable in appearance. Source of the air is not identified but is thought to be related to the patient's chest tube. There is no intra-abdominal fluid. No hemorrhage is identified.  The liver, spleen, adrenal glands, pancreas and kidneys appear normal. There is extensive aortoiliac atherosclerosis without aneurysm. The gallbladder is somewhat distended but otherwise unremarkable.  No bony abnormality is seen.  IMPRESSION: Massive volume of intra-abdominal free air and subcutaneous air within the abdomen sphere is likely related to the patient's chest tube. Bowel perforation cannot be completely excluded but is thought highly unlikely.  Small bilateral pneumothoraces.  New patchy bilateral airspace disease and  ground-glass attenuation could be due to pneumonia or possibly pulmonary hemorrhage. The extent of this abnormality would not account for the patient's hemoglobin decrease.  Small bilateral pleural effusions and pericardial effusion.  Nondisplaced right third rib fracture.  Marked cardiomegaly.  Calcific aortic and coronary atherosclerosis.  Critical Value/emergent results were called by telephone at the time of interpretation on 12/16/2014 at 1:10 pm to the patient's nurse, who verbally acknowledged these results.   Electronically Signed   By: Drusilla Kanner M.D.   On: 12/16/2014 13:15   Ct Head Wo Contrast  12/16/2014   CLINICAL DATA:  Altered mental status post arrest  EXAM: CT HEAD WITHOUT CONTRAST  TECHNIQUE: Contiguous axial images were obtained from the base of the skull through the vertex without intravenous contrast.  COMPARISON:  12/13/2014  FINDINGS: Images are motion degraded. Mild sinusitis predominantly in the sphenoid sinuses. No acute hemorrhage, infarct, or mass lesion is identified. No midline shift. Subjectively, there  is minimal relative hyperdensity and loss of sulcation involving the cerebellum but this finding is stable compared to previously and is felt to most likely be technical. No midline shift. No ventriculomegaly. Orbits are unremarkable. No skull fracture.  IMPRESSION: No evidence for acute intracranial finding.   Electronically Signed   By: Christiana Pellant M.D.   On: 12/16/2014 12:56   Ct Chest Wo Contrast  12/16/2014   CLINICAL DATA:  Anemia, blood loss of uncertain etiology. Hemoglobin 2.90 today. Fever. Out of hospital cardiac arrest 01/02/2015 with a subsequent arrest in the hospital.  EXAM: CT CHEST, ABDOMEN AND PELVIS WITHOUT CONTRAST  TECHNIQUE: Multidetector CT imaging of the chest, abdomen and pelvis was performed following the standard protocol without IV contrast.  COMPARISON:  CT chest 12/13/2014.  FINDINGS: CT CHEST FINDINGS  There small bilateral pleural effusions  and a small pericardial effusion. Marked cardiomegaly is seen. Calcific aortic and coronary atherosclerosis is identified. Fracture of the lateral arc of the right third rib is identified and was present on the prior CT. It is better visualized today. No other fracture is noted.  The patient has small bilateral pneumothoraces. Patchy bilateral airspace disease is seen in all lobes of the lungs with areas of ground-glass attenuation. No nodule or mass is identified.  CT ABDOMEN AND PELVIS FINDINGS  Massive subcutaneous gas is present about the abdomen and there is a large volume of free intraperitoneal air. Retroperitoneal air is also seen. The stomach and small and large bowel are unremarkable in appearance. Source of the air is not identified but is thought to be related to the patient's chest tube. There is no intra-abdominal fluid. No hemorrhage is identified.  The liver, spleen, adrenal glands, pancreas and kidneys appear normal. There is extensive aortoiliac atherosclerosis without aneurysm. The gallbladder is somewhat distended but otherwise unremarkable.  No bony abnormality is seen.  IMPRESSION: Massive volume of intra-abdominal free air and subcutaneous air within the abdomen sphere is likely related to the patient's chest tube. Bowel perforation cannot be completely excluded but is thought highly unlikely.  Small bilateral pneumothoraces.  New patchy bilateral airspace disease and ground-glass attenuation could be due to pneumonia or possibly pulmonary hemorrhage. The extent of this abnormality would not account for the patient's hemoglobin decrease.  Small bilateral pleural effusions and pericardial effusion.  Nondisplaced right third rib fracture.  Marked cardiomegaly.  Calcific aortic and coronary atherosclerosis.  Critical Value/emergent results were called by telephone at the time of interpretation on 12/16/2014 at 1:10 pm to the patient's nurse, who verbally acknowledged these results.   Electronically  Signed   By: Drusilla Kanner M.D.   On: 12/16/2014 13:15     ASSESSMENT / PLAN:  PULMONARY OETT 7.5 9/5>> A: Acute Hypoxic Respiratory Failure 2nd to mucus plug and Lt PTX. Hx of COPD with tobacco abuse. P:   Full vent support F/u CXR Continue chest tube to suction Continue scheduled xopenex, mucomyst for now  CARDIOVASCULAR L [place] CVL 9/5>> A:  Asystolic Arrest w/ repeat bradycardic arrest overnight 9/7. Hx of hypertension. 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 P:  Cardiology consulted & following peripherally Completed normothermia protocol Labetalol IV prn  RENAL A:   Hypokalemia - resolved. P:   Monitor electrolytes daily Monitor renal function w/ daily BUN/Creatinine Monitor UOP with foley catheter  GASTROINTESTINAL A:   Nutrition. Constipation. P:   TF per dietary recommendations Protonix for SUP Colace & Senna via tube bid  HEMATOLOGIC A:   Anemia, thrombocytopenia of critical illness. P:  F/u CBC SQ heparin for DVT prevention  INFECTIOUS A:   Aspiration pneumonia. Possible Bacteremia - question contaminant given multiple organisms. P:   BCx2 9/5>>1/2 bottles positive sensitive to Unasyn UC 9/5>>negative Sputum 9/5>>Oral Flora  Urine streptococcal antigen 9/8>> Negative Urine Legionella antigen 9/8>> Negative  Abx:  Unasyn, start date 9/5, day 5/x Vancomycin 9/6>>9/9  ENDOCRINE A:   H/O DM. P:   Accuchecks q4hr SSI coverage per algorithm  NEUROLOGIC A:   Possible Anoxic Brain Injury with episodes of myoclonus. P:   Continue diprivan F/u EEG Might need neurology to re-assess  CC time 35 minutes.  Coralyn Helling, MD [facility] Pulmonary/Critical Care 12/17/2014, 6:57 AM Pager:  [phone] After 3pm call: [phone]

## 2014-12-17 NOTE — Progress Notes (Signed)
eLink Physician-Brief Progress Note Patient Name: [name] DOB: 1961/04/05 MRN: [id number]   Date of Service  12/17/2014  HPI/Events of Note  Family now desires dnr status  eICU Interventions  See orders dnr      Intervention Category Major Interventions: End of life / care limitation discussion  Shan Levans 12/17/2014, 5:03 PM

## 2014-12-17 NOTE — Procedures (Signed)
ELECTROENCEPHALOGRAM REPORT   Patient: [name]      Room #: 2H-05 Age: 54 y.o.        Sex: female Referring Physician: Dr Tyson Alias Report Date:  12/17/2014        Interpreting Physician: Omelia Blackwater  History: [name] is an 54 y.o. female s/p cardiac arrest  Medications:  Continuous: . sodium chloride 10 mL/hr at 12/17/14 1100  . fentaNYL infusion INTRAVENOUS 100 mcg/hr (12/17/14 1100)  . propofol (DIPRIVAN) infusion 15.027 mcg/kg/min (12/17/14 1100)    Conditions of Recording:  This is a 19 channel EEG carried out with the patient in the sedated and intubated state.  Description:  The waking background activity consists of diffuse irregular low voltage mix of delta and theta activity. No posterior dominant rhythm is noted. No focal slowing or epileptiform activity noted.   Hyperventilation was not performed.  Intermittent photic stimulation was not performed.   IMPRESSION: Abnormal EEG due to the presence of generalized irregular slow activity consistent with a moderate to severe cerebral disturbance (encephalopathy). There was no seizure or seizure predisposition recorded on this study.    Elspeth Cho, DO [facility] [phone]  If 7pm- 7am, please page neurology on call as listed in AMION. 12/17/2014, 11:16 AM

## 2014-12-17 NOTE — Progress Notes (Signed)
eLink Physician-Brief Progress Note Patient Name: Kersti Scavone DOB: [birth year] MRN: [id number]   Date of Service  12/17/2014  HPI/Events of Note  RN notified E MD that pt having seizure-like movements.  eICU Interventions  EEG/Propofol     Intervention Category Major Interventions: Seizures - evaluation and management  Lawanda Cousins 12/17/2014, 4:40 AM

## 2014-12-17 NOTE — Progress Notes (Signed)
Pt becoming increasingly more restless with twitching and jerking movements. Notified [facility]. New orders placed. Will continue to monitor.

## 2014-12-18 ENCOUNTER — Inpatient Hospital Stay (HOSPITAL_COMMUNITY): Payer: Medicare HMO

## 2014-12-18 DIAGNOSIS — Z515 Encounter for palliative care: Secondary | ICD-10-CM | POA: Insufficient documentation

## 2014-12-18 DIAGNOSIS — Z66 Do not resuscitate: Secondary | ICD-10-CM

## 2014-12-18 LAB — RENAL FUNCTION PANEL
ALBUMIN: 1.7 g/dL — AB (ref 3.5–5.0)
ANION GAP: 8 (ref 5–15)
BUN: 25 mg/dL — ABNORMAL HIGH (ref 6–20)
CALCIUM: 8.7 mg/dL — AB (ref 8.9–10.3)
CO2: 31 mmol/L (ref 22–32)
CREATININE: 0.93 mg/dL (ref 0.44–1.00)
Chloride: 105 mmol/L (ref 101–111)
GFR calc non Af Amer: >60 mL/min (ref >60)
GLUCOSE: 125 mg/dL — AB (ref 65–99)
PHOSPHORUS: 4.3 mg/dL (ref 2.5–4.6)
Potassium: 3.7 mmol/L (ref 3.5–5.1)
SODIUM: 144 mmol/L (ref 135–145)

## 2014-12-18 LAB — CBC
HCT: 22.6 % — ABNORMAL LOW (ref 36.0–46.0)
HEMOGLOBIN: 7.3 g/dL — AB (ref 12.0–15.0)
MCH: 31.3 pg (ref 26.0–34.0)
MCHC: 32.3 g/dL (ref 30.0–36.0)
MCV: 97 fL (ref 78.0–100.0)
PLATELETS: 158 10*3/uL (ref 150–400)
RBC: 2.33 MIL/uL — AB (ref 3.87–5.11)
RDW: 13.8 % (ref 11.5–15.5)
WBC: 5.6 10*3/uL (ref 4.0–10.5)

## 2014-12-18 LAB — GLUCOSE, CAPILLARY
GLUCOSE-CAPILLARY: 115 mg/dL — AB (ref 65–99)
GLUCOSE-CAPILLARY: 138 mg/dL — AB (ref 65–99)
Glucose-Capillary: 125 mg/dL — ABNORMAL HIGH (ref 65–99)

## 2014-12-18 LAB — MAGNESIUM: MAGNESIUM: 1.9 mg/dL (ref 1.7–2.4)

## 2014-12-18 LAB — TRIGLYCERIDES: Triglycerides: 201 mg/dL — ABNORMAL HIGH (ref <150)

## 2014-12-18 MED ORDER — LEVETIRACETAM 500 MG/5ML IV SOLN
1000.0000 mg | Freq: Two times a day (BID) | INTRAVENOUS | Status: DC
  Administered 2014-12-18 – 2014-12-20 (×4): 1000 mg via INTRAVENOUS
  Filled 2014-12-18 (×6): qty 10

## 2014-12-18 NOTE — Consult Note (Signed)
Consultation Note Date: 12/18/2014   Patient Name: [name]  DOB: 1960-09-25  MRN: [id number]  Age / Sex: 53 y.o., female   PCP: Provider Not In System Referring Physician: Chesley Mires, MD  Reason for Consultation: Establishing goals of care, Non pain symptom management, Pain control, Psychosocial/spiritual support, Terminal care and Withdrawal of life-sustaining treatment  Palliative Care Assessment and Plan Summary of Established Goals of Care and Medical Treatment Preferences   Clinical Assessment/Narrative: Patient is a 54 year old female who sustained an out-of-hospital arrest. Upon EMS arrival patient had 6 minutes of CPR. It was suspected she aspirated in transit. She was intubated in the emergency room and underwent normothermia protocol. On 12/14/2014 patient sustained a bradycardic arrest with a mucous plug, status post bronc and placement of left chest tube. Patient began to exhibit myoclonus on 12/17/2014 and that has persisted into today.  EEG reflects anoxic brain injury. She is running a low-grade temperature today of 99 1. She is on a fentanyl infusion at 100 g an hour, and propofol infusion at 25 g an hour. Keppra has been initiated secondary to ongoing myoclonus. She is breathing some on her own over the event. She is having increased secretions. Per RN at the bedside her respiratory status has been declining over night. Met with patient's son Tommie Raymond, daughter Anderson Malta, and sister Jeralene Huff, to discuss patient's clinical condition. Children were  able to clearly articulate that their mother would not view this is quality of life. They also report that she was a spiritual woman and would be prepared for death. They appear to understand her medical condition and the futility of future measures such as a trach or PEG. Plan is to terminally extubate once friends and family have been notified  Contacts/Participants in Discussion: Primary Decision Maker: Son Tommie Raymond as well as  daughter Anderson Malta, sister Veranda   HCPOA: no  Patient is divorced, son Tommie Raymond and daughter Anderson Malta are her only children thus have healthcare proxy  Code Status/Advance Care Planning:  DO NOT RESUSCITATE. Family has elected comfort measures. No usage of pressors  They wish to call some family members to see if they want to see Mrs. Kautzman before she is extubated  Anticipate family making a decision to extubate within the next 24 hours, but exact time of extubation is still pending  Turnoff tube feedings secondary to excess secretions  Continue fentanyl and propofol  Symptom Management:   Pain: Continue fentanyl continuous infusion at 100 g an hour for now. Anticipate need for up titration during extubation, and postextubation.  Dyspnea: Patient to remain on the vent until family informs [place] of appropriate time for extubation. Continue fentanyl infusion and we'll up titrate during extubation and postextubation. We'll also continue propofol to 8 in comfort. Secretions: We'll turnoff tube feedings to see if this reduces her volume overload.Will monitor for need for scheduled robinul Additional Recommendations (Limitations, Scope, Preferences):  Discussed being able to move out of ICU postextubation for improved comfort for patient and family Psycho-social/Spiritual:   Support System: Yes  Desire for further Chaplaincy support:yes  Prognosis: Hours - Days  Once pt is extubated Discharge Planning:  Anticipate hospital death but if she stabilizes may dc to in-patient hospice       Chief Complaint/History of Present Illness: Patient is a 54 year old female who had an asystolic arrest on 97/67/3419. She was found pulseless on arrival by EMS. 6 minutes of CPR was initiated. Suspected aspiration occurred during intubation. She rearrested on 12/14/2014 and sustained a pneumothorax. She  completed normothermia protocol on 12/15/2014 and myoclonus was noted. Her myoclonus has persisted  and family is now trending towards terminal extubation secondary to anoxic brain injury  Primary Diagnoses  Present on Admission:  . Cardiac arrest due to respiratory disorder . Cardiac arrest  Palliative Review of Systems: Patient is intubated I have reviewed the medical record, interviewed the patient and family, and examined the patient. The following aspects are pertinent.  Past Medical History  Diagnosis Date  . Hypertension   . High cholesterol   . Diabetes mellitus   . Coronary artery disease   . Heart murmur   . CHF (congestive heart failure) 2010  . Bipolar 1 disorder 11/26/2011    "not on medicine"  . Anxiety    Social History   Social History  . Marital Status: Widowed    Spouse Name: N/A  . Number of Children: N/A  . Years of Education: N/A   Social History Main Topics  . Smoking status: Current Every Day Smoker -- 0.50 packs/day    Types: Cigarettes  . Smokeless tobacco: Never Used  . Alcohol Use: No  . Drug Use: No  . Sexual Activity: No   Other Topics Concern  . None   Social History Narrative   Family History  Problem Relation Age of Onset  . Hypertension Mother   . Hypertension Father    Scheduled Meds: . ampicillin-sulbactam (UNASYN) IV  3 g Intravenous Q6H  . antiseptic oral rinse  7 mL Mouth Rinse 10 times per day  . chlorhexidine gluconate  15 mL Mouth Rinse BID  . docusate sodium  100 mg Oral BID  . feeding supplement (VITAL HIGH PROTEIN)  1,000 mL Per Tube Q24H  . heparin subcutaneous  5,000 Units Subcutaneous 3 times per day  . insulin aspart  2-6 Units Subcutaneous 6 times per day  . levalbuterol  0.63 mg Nebulization Q6H  . levETIRAcetam  1,000 mg Intravenous Q12H  . pantoprazole sodium  40 mg Per Tube Q24H  . sennosides  5 mL Per Tube BID   Continuous Infusions: . sodium chloride 10 mL/hr at 12/17/14 1800  . fentaNYL infusion INTRAVENOUS 100 mcg/hr (12/17/14 1922)  . propofol (DIPRIVAN) infusion 20 mcg/kg/min (12/18/14 0617)     PRN Meds:.acetaminophen (TYLENOL) oral liquid 160 mg/5 mL, fentaNYL, labetalol, LORazepam Medications Prior to Admission:  Prior to Admission medications   Medication Sig Start Date End Date Taking? Authorizing Provider  albuterol (PROAIR HFA) 108 (90 BASE) MCG/ACT inhaler Inhale 2 puffs into the lungs every 6 (six) hours as needed. wheezing 05/06/12  Yes Historical Provider, MD  ALPRAZolam (XANAX) 0.25 MG tablet Take 0.25 mg by mouth 3 (three) times daily as needed.  12/05/14  Yes Historical Provider, MD  lamoTRIgine (LAMICTAL) 25 MG tablet Take 25 mg by mouth 2 (two) times daily.   Yes Historical Provider, MD  lisinopril-hydrochlorothiazide (ZESTORETIC) 20-12.5 MG per tablet Take 2 tablets by mouth daily. 05/26/14 05/26/15 Yes Historical Provider, MD  metFORMIN (GLUCOPHAGE) 1000 MG tablet Take 1,000 mg by mouth 2 (two) times daily with a meal.   Yes Historical Provider, MD  metoprolol tartrate (LOPRESSOR) 25 MG tablet Take 25 mg by mouth 2 (two) times daily.   Yes Historical Provider, MD  pravastatin (PRAVACHOL) 40 MG tablet Take 40 mg by mouth daily. 06/03/14 06/03/15 Yes Historical Provider, MD   Allergies  Allergen Reactions  . Hydrocodone Itching  . Erythromycin Rash   CBC:    Component Value Date/Time   WBC  5.6 12/18/2014 0450   HGB 7.3* 12/18/2014 0450   HCT 22.6* 12/18/2014 0450   PLT 158 12/18/2014 0450   MCV 97.0 12/18/2014 0450   NEUTROABS 3.2 12/17/2014 0205   LYMPHSABS 0.8 12/17/2014 0205   MONOABS 0.8 12/17/2014 0205   EOSABS 0.1 12/17/2014 0205   BASOSABS 0.0 12/17/2014 0205   Comprehensive Metabolic Panel:    Component Value Date/Time   NA 144 12/18/2014 0450   K 3.7 12/18/2014 0450   CL 105 12/18/2014 0450   CO2 31 12/18/2014 0450   BUN 25* 12/18/2014 0450   CREATININE 0.93 12/18/2014 0450   GLUCOSE 125* 12/18/2014 0450   CALCIUM 8.7* 12/18/2014 0450   AST 29 12/14/2014 0400   ALT 19 12/14/2014 0400   ALKPHOS 48 12/14/2014 0400   BILITOT 0.4 12/14/2014  0400   PROT 5.4* 12/14/2014 0400   ALBUMIN 1.7* 12/18/2014 0450    Physical Exam: Vital Signs: BP 140/60 mmHg  Pulse 99  Temp(Src) 98.1 F (36.7 C) (Oral)  Resp 12  Ht $R'5\' 2"'Tz$  (1.575 m)  Wt 64.1 kg (141 lb 5 oz)  BMI 25.84 kg/m2  SpO2 99%  LMP 06/26/2011 SpO2: SpO2: 99 % O2 Device: O2 Device: Ventilator O2 Flow Rate: O2 Flow Rate (L/min): 15 L/min Intake/output summary:  Intake/Output Summary (Last 24 hours) at 12/18/14 1145 Last data filed at 12/18/14 0754  Gross per 24 hour  Intake 1809.7 ml  Output   1233 ml  Net  576.7 ml   LBM: Last BM Date:  (PTA) Baseline Weight: Weight: 72.576 kg (160 lb) Most recent weight: Weight: 64.1 kg (141 lb 5 oz)  Exam Findings:  Gen.: Patient is a well-nourished middle-aged female. She is critically ill in ICU. She is intubated Respiratory: Patient is on the ventilator. She is breathing on her own, but has a lot of secretions         Palliative Performance Scale: 10%              Additional Data Reviewed: Recent Labs     12/17/14  0205  12/18/14  0450  WBC  5.0  5.6  HGB  8.7*  7.3*  PLT  138*  158  NA  139  144  BUN  17  25*  CREATININE  0.97  0.93     Time In: 1000 Time Out: 1130 Time Total: 90 min.Greater than 50%  of this time was spent counseling and coordinating care related to the above assessment and plan. Discussed with Dr. Halford Chessman  Signed by: Dory Horn, NP  Dory Horn, NP  12/18/2014, 11:45 AM  Please contact Palliative Medicine Team phone at [phone] for questions and concerns.

## 2014-12-18 NOTE — Progress Notes (Addendum)
PULMONARY / CRITICAL CARE MEDICINE   Name: [name] MRN: [id number] DOB: 12/09/1960    ADMISSION DATE:  12-27-2014 CONSULTATION DATE:  12-27-2014  REFERRING MD :  ED  CHIEF COMPLAINT:  Asystolic Arrest  INITIAL PRESENTATION: Out of hospital arrest. Patient called EMS w/ dyspnea. Found pulseless on their arrival. 6 minutes of CPR for ROSC. Suspected aspiration in transit to ED because of [facility] airway.  STUDIES:  EEG 9/5 - burst suppression but also on Propofol. CTA Chest 9/6 - No PE. Right mainstem intubation. Atelectasis. Bilateral upper lobe opacities. Mild cardiomegaly. CT Head w/o 9/6 - No acute osseus abnormality. Gray-white matter diff preserved. No hemorrhage or increased ICP. Echo 9/6 - severe LVH, hypertrophic CM, grade 1 diastolic dysfx EEG 9/7 - Generalized nonspecific cerebral dysfunction (encephalopathy). No seizure or burst suppression.  SIGNIFICANT EVENTS: 9/5 - Intubated in ED 9/5 - Cooled w/ normothermia protocol 9/7 - Bradycardic arrest w/ Mucus plug & Pneumothorax >> s/p bronch & left chest tube placed 9/8 - Completed normothermia protocol; myoclonus noted 9/10 - DNR established 9/11 - persistent myoclonus  SUBJECTIVE:  Restarted diprivan last night for myoclonus.  VITAL SIGNS: Temp:  [99.5 F (37.5 C)-100.9 F (38.3 C)] 99.5 F (37.5 C) (09/11 0600) Pulse Rate:  [79-119] 93 (09/11 0600) Resp:  [11-25] 13 (09/11 0600) BP: (85-184)/(36-72) 130/50 mmHg (09/11 0600) SpO2:  [96 %-100 %] 98 % (09/11 0600) FiO2 (%):  [40 %] 40 % (09/11 0304) Weight:  [141 lb 5 oz (64.1 kg)] 141 lb 5 oz (64.1 kg) (09/11 0500) VENTILATOR SETTINGS: Vent Mode:  [-] PRVC FiO2 (%):  [40 %] 40 % Set Rate:  [14 bmp] 14 bmp Vt Set:  [350 mL] 350 mL PEEP:  [5 cmH20] 5 cmH20 Pressure Support:  [5 cmH20] 5 cmH20 Plateau Pressure:  [19 cmH20-21 cmH20] 19 cmH20 INTAKE / OUTPUT:  Intake/Output Summary (Last 24 hours) at 12/18/14 0704 Last data filed at 12/18/14 4098  Gross per 24  hour  Intake 2164.76 ml  Output   1272 ml  Net 892.76 ml    PHYSICAL EXAMINATION: General: on propofol Integument: no rashes HEENT: pupils pinpoint, weakly reactive Cardiovascular: regular Pulmonary: no wheeze, no air leak Abdomen: Soft. Normal bowel sounds. Nondistended. Neurologic: grimaces with stimulation with episodes of myoclonic jerks  LABS:  CBC  Recent Labs Lab 12/16/14 0420 12/16/14 1315 12/17/14 0205 12/18/14 0450  WBC 4.6  --  5.0 5.6  HGB 9.1* 8.5* 8.7* 7.3*  HCT 27.4* 26.4* 26.6* 22.6*  PLT 128*  --  138* 158   Coag's  Recent Labs Lab 27-Dec-2014 0045 27-Dec-2014 0411  APTT 28 26  INR 1.03 1.08   BMET  Recent Labs Lab 12/16/14 0420 12/17/14 0205 12/18/14 0450  NA 138 139 144  K 3.9 3.6 3.7  CL 102 102 105  CO2 29 31 31   BUN 16 17 25*  CREATININE 0.89 0.97 0.93  GLUCOSE 161* 124* 125*   Electrolytes  Recent Labs Lab 12/16/14 0420 12/17/14 0205 12/18/14 0450  CALCIUM 8.3* 8.5* 8.7*  MG 1.8 1.9 1.9  PHOS 2.9 2.8 4.3   Sepsis Markers  Recent Labs Lab 12/27/2014 0044 2014-12-27 0110 12/15/14 0915 12/16/14 0420 12/17/14 0205  LATICACIDVEN 5.0* 5.05*  --   --   --   PROCALCITON  --   --  2.32 1.84 5.90   ABG  Recent Labs Lab 12/14/14 2133 12/14/14 2300 12/15/14 0935  PHART 7.214* 7.269* 7.326*  PCO2ART 62.7* 49.4* 48.6*  PO2ART 406.0* 57.0*  101*   Liver Enzymes  Recent Labs Lab 12/13/14 1120 12/14/14 0400  12/16/14 0420 12/17/14 0205 12/18/14 0450  AST 23 29  --   --   --   --   ALT 22 19  --   --   --   --   ALKPHOS 51 48  --   --   --   --   BILITOT 0.5 0.4  --   --   --   --   ALBUMIN 3.1* 2.7*  < > 1.9* 1.8* 1.7*  < > = values in this interval not displayed.   Cardiac Enzymes  Recent Labs Lab 12/14/14 2137 12/15/14 0320 12/15/14 0936  TROPONINI 2.40* 1.74* 1.72*   Glucose  Recent Labs Lab 12/17/14 0715 12/17/14 1206 12/17/14 1631 12/17/14 1942 12/18/14 0019 12/18/14 0448  GLUCAP 131* 117* 130*  115* 138* 115*    Imaging Ct Abdomen Pelvis Wo Contrast  12/16/2014   CLINICAL DATA:  Anemia, blood loss of uncertain etiology. Hemoglobin 2.90 today. Fever. Out of hospital cardiac arrest 01/11/15 with a subsequent arrest in the hospital.  EXAM: CT CHEST, ABDOMEN AND PELVIS WITHOUT CONTRAST  TECHNIQUE: Multidetector CT imaging of the chest, abdomen and pelvis was performed following the standard protocol without IV contrast.  COMPARISON:  CT chest 12/13/2014.  FINDINGS: CT CHEST FINDINGS  There small bilateral pleural effusions and a small pericardial effusion. Marked cardiomegaly is seen. Calcific aortic and coronary atherosclerosis is identified. Fracture of the lateral arc of the right third rib is identified and was present on the prior CT. It is better visualized today. No other fracture is noted.  The patient has small bilateral pneumothoraces. Patchy bilateral airspace disease is seen in all lobes of the lungs with areas of ground-glass attenuation. No nodule or mass is identified.  CT ABDOMEN AND PELVIS FINDINGS  Massive subcutaneous gas is present about the abdomen and there is a large volume of free intraperitoneal air. Retroperitoneal air is also seen. The stomach and small and large bowel are unremarkable in appearance. Source of the air is not identified but is thought to be related to the patient's chest tube. There is no intra-abdominal fluid. No hemorrhage is identified.  The liver, spleen, adrenal glands, pancreas and kidneys appear normal. There is extensive aortoiliac atherosclerosis without aneurysm. The gallbladder is somewhat distended but otherwise unremarkable.  No bony abnormality is seen.  IMPRESSION: Massive volume of intra-abdominal free air and subcutaneous air within the abdomen sphere is likely related to the patient's chest tube. Bowel perforation cannot be completely excluded but is thought highly unlikely.  Small bilateral pneumothoraces.  New patchy bilateral airspace  disease and ground-glass attenuation could be due to pneumonia or possibly pulmonary hemorrhage. The extent of this abnormality would not account for the patient's hemoglobin decrease.  Small bilateral pleural effusions and pericardial effusion.  Nondisplaced right third rib fracture.  Marked cardiomegaly.  Calcific aortic and coronary atherosclerosis.  Critical Value/emergent results were called by telephone at the time of interpretation on 12/16/2014 at 1:10 pm to the patient's nurse, who verbally acknowledged these results.   Electronically Signed   By: Drusilla Kanner M.D.   On: 12/16/2014 13:15   Ct Head Wo Contrast  12/16/2014   CLINICAL DATA:  Altered mental status post arrest  EXAM: CT HEAD WITHOUT CONTRAST  TECHNIQUE: Contiguous axial images were obtained from the base of the skull through the vertex without intravenous contrast.  COMPARISON:  12/13/2014  FINDINGS: Images are motion degraded.  Mild sinusitis predominantly in the sphenoid sinuses. No acute hemorrhage, infarct, or mass lesion is identified. No midline shift. Subjectively, there is minimal relative hyperdensity and loss of sulcation involving the cerebellum but this finding is stable compared to previously and is felt to most likely be technical. No midline shift. No ventriculomegaly. Orbits are unremarkable. No skull fracture.  IMPRESSION: No evidence for acute intracranial finding.   Electronically Signed   By: Christiana Pellant M.D.   On: 12/16/2014 12:56   Ct Chest Wo Contrast  12/16/2014   CLINICAL DATA:  Anemia, blood loss of uncertain etiology. Hemoglobin 2.90 today. Fever. Out of hospital cardiac arrest 01/01/2015 with a subsequent arrest in the hospital.  EXAM: CT CHEST, ABDOMEN AND PELVIS WITHOUT CONTRAST  TECHNIQUE: Multidetector CT imaging of the chest, abdomen and pelvis was performed following the standard protocol without IV contrast.  COMPARISON:  CT chest 12/13/2014.  FINDINGS: CT CHEST FINDINGS  There small bilateral pleural  effusions and a small pericardial effusion. Marked cardiomegaly is seen. Calcific aortic and coronary atherosclerosis is identified. Fracture of the lateral arc of the right third rib is identified and was present on the prior CT. It is better visualized today. No other fracture is noted.  The patient has small bilateral pneumothoraces. Patchy bilateral airspace disease is seen in all lobes of the lungs with areas of ground-glass attenuation. No nodule or mass is identified.  CT ABDOMEN AND PELVIS FINDINGS  Massive subcutaneous gas is present about the abdomen and there is a large volume of free intraperitoneal air. Retroperitoneal air is also seen. The stomach and small and large bowel are unremarkable in appearance. Source of the air is not identified but is thought to be related to the patient's chest tube. There is no intra-abdominal fluid. No hemorrhage is identified.  The liver, spleen, adrenal glands, pancreas and kidneys appear normal. There is extensive aortoiliac atherosclerosis without aneurysm. The gallbladder is somewhat distended but otherwise unremarkable.  No bony abnormality is seen.  IMPRESSION: Massive volume of intra-abdominal free air and subcutaneous air within the abdomen sphere is likely related to the patient's chest tube. Bowel perforation cannot be completely excluded but is thought highly unlikely.  Small bilateral pneumothoraces.  New patchy bilateral airspace disease and ground-glass attenuation could be due to pneumonia or possibly pulmonary hemorrhage. The extent of this abnormality would not account for the patient's hemoglobin decrease.  Small bilateral pleural effusions and pericardial effusion.  Nondisplaced right third rib fracture.  Marked cardiomegaly.  Calcific aortic and coronary atherosclerosis.  Critical Value/emergent results were called by telephone at the time of interpretation on 12/16/2014 at 1:10 pm to the patient's nurse, who verbally acknowledged these results.    Electronically Signed   By: Drusilla Kanner M.D.   On: 12/16/2014 13:15      ASSESSMENT / PLAN:  PULMONARY ETT 9/5>> A: Acute Hypoxic Respiratory Failure 2nd to mucus plug and Lt PTX. Hx of COPD with tobacco abuse. P:   Full vent support F/u CXR Continue chest tube to suction Continue scheduled xopenex  CARDIOVASCULAR L [place] CVL 9/5>> A:  Asystolic Arrest w/ repeat bradycardic arrest overnight 9/7. Hx of hypertension. 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 P:  Cardiology consulted & following peripherally Completed normothermia protocol Labetalol IV prn  RENAL A:   Hypokalemia - resolved. P:   Monitor electrolytes daily Monitor renal function w/ daily BUN/Creatinine Monitor UOP with foley catheter  GASTROINTESTINAL A:   Nutrition. Constipation. P:   TF per dietary recommendations Protonix for  SUP Colace & Senna via tube bid  HEMATOLOGIC A:   Anemia, thrombocytopenia of critical illness >> no obvious source of bleeding. P:  F/u CBC intermittently SQ heparin for DVT prevention  INFECTIOUS A:   Aspiration pneumonia. Possible Bacteremia - question contaminant given multiple organisms. P:   BCx2 9/5>>1/2 bottles positive sensitive to Unasyn UC 9/5>>negative Sputum 9/5>>Oral Flora  Urine streptococcal antigen 9/8>> Negative Urine Legionella antigen 9/8>> Negative  Abx:  Unasyn, start date 9/5 >> Vancomycin 9/6>>9/9  ENDOCRINE A:   H/O DM. P:   Accuchecks q4hr SSI coverage per algorithm  NEUROLOGIC A:   Possible Anoxic Brain Injury with episodes of myoclonus. P:   Continue diprivan Might need neurology to re-assess Add keppra 9/11  Summary: Concern she has significant anoxic brain injury with persistent myoclonus.  She is DNR.  Will ask palliative care to assist with family discussions about goals of care.   CC time 32 minutes.  Coralyn Helling, MD [facility] Pulmonary/Critical Care 12/18/2014, 7:04 AM Pager:  [phone] After 3pm call:  [phone]

## 2014-12-18 NOTE — Progress Notes (Signed)
ANTIBIOTIC CONSULT NOTE - Follow Up  Pharmacy Consult for Unasyn Indication: bacteremia  Allergies  Allergen Reactions  . Hydrocodone Itching  . Erythromycin Rash    Patient Measurements: Height: 5\' 2"  (157.5 cm) Weight: 141 lb 5 oz (64.1 kg) IBW/kg (Calculated) : 50.1  Vital Signs: Temp: 98.1 F (36.7 C) (09/11 0821) Temp Source: Oral (09/11 0821) BP: 140/60 mmHg (09/11 0700) Pulse Rate: 99 (09/11 0700) Intake/Output from previous day: 09/10 0701 - 09/11 0700 In: 2242.1 [I.V.:692.1; NG/GT:1150; IV Piggyback:400] Out: 1422 [Urine:1360; Chest Tube:62] Intake/Output from this shift: Total I/O In: -  Out: 90 [Urine:90]  Labs:  Recent Labs  12/16/14 0420 12/16/14 1315 12/17/14 0205 12/18/14 0450  WBC 4.6  --  5.0 5.6  HGB 9.1* 8.5* 8.7* 7.3*  PLT 128*  --  138* 158  CREATININE 0.89  --  0.97 0.93   Estimated Creatinine Clearance: 59.4 mL/min (by C-G formula based on Cr of 0.93).  Microbiology: Recent Results (from the past 720 hour(s))  MRSA PCR Screening     Status: None   Collection Time: 12/23/2014  2:57 AM  Result Value Ref Range Status   MRSA by PCR NEGATIVE NEGATIVE Final    Comment:        The GeneXpert MRSA Assay (FDA approved for NASAL specimens only), is one component of a comprehensive MRSA colonization surveillance program. It is not intended to diagnose MRSA infection nor to guide or monitor treatment for MRSA infections.   Urine culture     Status: None   Collection Time: 12/08/2014 11:44 AM  Result Value Ref Range Status   Specimen Description URINE, CATHETERIZED  Final   Special Requests NONE  Final   Culture NO GROWTH 1 DAY  Final   Report Status 12/13/2014 FINAL  Final  Culture, respiratory (NON-Expectorated)     Status: None   Collection Time: 01/02/2015 12:10 PM  Result Value Ref Range Status   Specimen Description TRACHEAL ASPIRATE  Final   Special Requests Normal  Final   Gram Stain   Final    RARE WBC PRESENT,BOTH PMN AND  MONONUCLEAR NO SQUAMOUS EPITHELIAL CELLS SEEN NO ORGANISMS SEEN Performed at [facility]    Culture   Final    Non-Pathogenic Oropharyngeal-type Flora Isolated. Performed at [facility]    Report Status 12/14/2014 FINAL  Final  Culture, blood (routine x 2)     Status: None   Collection Time: 12/15/2014  1:00 PM  Result Value Ref Range Status   Specimen Description BLOOD RIGHT HAND  Final   Special Requests BOTTLES DRAWN AEROBIC ONLY 2CC  Final   Culture NO GROWTH 5 DAYS  Final   Report Status 12/17/2014 FINAL  Final  Culture, blood (routine x 2)     Status: None   Collection Time: 01/05/2015  1:15 PM  Result Value Ref Range Status   Specimen Description BLOOD LEFT HAND  Final   Special Requests BOTTLES DRAWN AEROBIC ONLY 1.5CC  Final   Culture  Setup Time   Final    GRAM POSITIVE RODS AEROBIC BOTTLE ONLY CRITICAL RESULT CALLED TO, READ BACK BY AND VERIFIED WITH: P DAVIS,RN AT 1343 12/13/14 BY L BENFIELD    Culture   Final    FLAVIMONAS ORYZIHABITANS DIPHTHEROIDS(CORYNEBACTERIUM SPECIES) Standardized susceptibility testing for this organism is not available. STAPHYLOCOCCUS SPECIES (COAGULASE NEGATIVE) THE SIGNIFICANCE OF ISOLATING THIS ORGANISM FROM A SINGLE SET OF BLOOD CULTURES WHEN MULTIPLE SETS ARE DRAWN IS UNCERTAIN. PLEASE NOTIFY THE MICROBIOLOGY DEPARTMENT  WITHIN ONE WEEK IF SPECIATION AND SENSITIVITIES ARE REQUIRED.    Report Status 12/15/2014 FINAL  Final   Organism ID, Bacteria FLAVIMONAS ORYZIHABITANS  Final      Susceptibility   Flavimonas oryzihabitans - MIC*    AMPICILLIN 8 SENSITIVE Sensitive     CEFAZOLIN >=64 RESISTANT Resistant     CEFTAZIDIME <=1 SENSITIVE Sensitive     CEFTRIAXONE 2 SENSITIVE Sensitive     CIPROFLOXACIN <=0.25 SENSITIVE Sensitive     GENTAMICIN <=1 SENSITIVE Sensitive     IMIPENEM <=0.25 SENSITIVE Sensitive     TRIMETH/SULFA <=20 SENSITIVE Sensitive     AMPICILLIN/SULBACTAM 4 SENSITIVE Sensitive     PIP/TAZO <=4 SENSITIVE  Sensitive     * FLAVIMONAS ORYZIHABITANS    Medical History: Past Medical History  Diagnosis Date  . Hypertension   . High cholesterol   . Diabetes mellitus   . Coronary artery disease   . Heart murmur   . CHF (congestive heart failure) 2010  . Bipolar 1 disorder 11/26/2011    "not on medicine"  . Anxiety    Assessment: 54yo woman admitted 01-04-15 s/p cardiac arrest on Unasyn for aspiration pneumonia.  ID: WBC wnl, afebrile, 9/10 procal 5.9  9/5 unasyn 3g IV q6h>> 9/6 vancomycin>>9/9  9/5 urine: pending 9/5 blood x2: falvimonas oryzihabitans diphtheroids (Corynebacterium) in 1/2 bottles, sensitive to Unasyn  Goal of Therapy:  Vancomycin trough level 15-20 mcg/ml  Plan:  1) Continue Unasyn 3g IV q6 2) Monitor renal function 3) Follow up duration of therapy   [name], Pharm.D. PGY2 Pharmacy Resident Pager: [phone]  12/18/2014 11:53 AM

## 2014-12-19 DIAGNOSIS — R0600 Dyspnea, unspecified: Secondary | ICD-10-CM | POA: Insufficient documentation

## 2014-12-19 DIAGNOSIS — Z515 Encounter for palliative care: Secondary | ICD-10-CM

## 2014-12-19 MED ORDER — SODIUM CHLORIDE 0.9 % IV SOLN
25.0000 ug/h | INTRAVENOUS | Status: DC
  Administered 2014-12-19 – 2014-12-20 (×2): 150 ug/h via INTRAVENOUS
  Filled 2014-12-19 (×4): qty 50

## 2014-12-19 MED ORDER — MIDAZOLAM BOLUS VIA INFUSION
5.0000 mg | INTRAVENOUS | Status: DC | PRN
  Filled 2014-12-19 (×2): qty 20

## 2014-12-19 MED ORDER — ACETAMINOPHEN 650 MG RE SUPP: 650.0000 mg | RECTAL | Status: DC | PRN

## 2014-12-19 MED ORDER — FENTANYL BOLUS VIA INFUSION
50.0000 ug | INTRAVENOUS | Status: DC | PRN
  Filled 2014-12-19 (×2): qty 200

## 2014-12-19 MED ORDER — ATROPINE SULFATE 1 % OP SOLN
4.0000 [drp] | OPHTHALMIC | Status: DC | PRN
  Administered 2014-12-20: 4 [drp] via SUBLINGUAL
  Filled 2014-12-19 (×2): qty 2

## 2014-12-19 MED ORDER — SODIUM CHLORIDE 0.9 % IV SOLN
1.0000 mg/h | INTRAVENOUS | Status: DC
  Administered 2014-12-19 (×2): 7 mg/h via INTRAVENOUS
  Administered 2014-12-19: 2 mg/h via INTRAVENOUS
  Administered 2014-12-20 (×2): 7 mg/h via INTRAVENOUS
  Filled 2014-12-19 (×5): qty 10

## 2014-12-19 NOTE — Procedures (Signed)
Extubation Procedure Note  Patient Details:   Name: [name] DOB: 06-17-1960 MRN: [id number]   Airway Documentation:  Airway 7.5 mm (Active)  Secured at (cm) 21 cm 12/19/2014  8:00 AM  Measured From Lips 12/19/2014  8:00 AM  Secured Location Right 12/19/2014  8:00 AM  Secured By [employer] 12/19/2014  8:00 AM  Tube Holder Repositioned Yes 12/19/2014  7:46 AM  Cuff Pressure (cm H2O) 28 cm H2O 12/18/2014 11:33 PM  Site Condition Dry 12/19/2014  8:00 AM    Evaluation  O2 sats: currently acceptable Complications: No apparent complications Patient did not tolerate procedure well. Bilateral Breath Sounds: Diminished, Rhonchi Suctioning: Airway No  Renae Fickle 12/19/2014, 12:16 PM

## 2014-12-19 NOTE — Progress Notes (Signed)
PCCM PROGRESS NOTE  [name] is a 54 y.o. female smoker admitted on 12-16-14 with dyspnea.  She developed respiratory arrest leading to cardiac arrest.  She received approximately 6 minutes of CPR before ROSC.  She was intubated.  There was concern for aspiration, and she was started on antibiotics.  Cardiology was consulted.  She was placed on normothermia protocol.  She developed myoclonus.  Neurology was consulted.  EEG showed generalized cerebral dysfunction concerning for anoxic brain injury.  She had respiratory distress and was found to have left pneumothorax after developing mucus plug.  This lead to recurrent PEA cardiac arrest with 10 minutes of CPR before ROSC.  She had chest tube inserted and had bronchoscopy.  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  She required diprivan and keppra to control myoclonus.  Palliative care was consulted.  Decision was made for DNR stats.  SUBJECTIVE: Appears comfortable on sedation, but still intermittently having myoclonus.  OBJECTIVE: BP 148/62 mmHg  Pulse 101  Temp(Src) 100.2 F (37.9 C) (Core (Comment))  Resp 16  Ht  (1.575 m)  Wt 139 lb 8.8 oz (63.3 kg)  BMI 25.52 kg/m2  SpO2 99%  LMP 06/26/2011  General: no distress HEENT: ETT in place Cardiac: regular Chest: no wheeze, Lt chest tube in place Abd: soft Ext: no edema Neuro: opens eyes with stimulation, doesn't follow commands, intermittent myoclonic jerks  ASSESSMENT: Acute respiratory failure with respiratory arrest Cardiac arrest with PEA Hypertrophic cardiomyopathy Hx of COPD Aspiration pneumonitis Tobacco abuse Spontaneous Left pneumothorax Mucus plugging with atelectasis Hypokalemia Constipation Anemia of critical illness Thrombocytopenia of critical illness Hx of DM type II Peripheral artery disease Hx of HTN Hx of HLD Hx of CAD 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

## 2014-12-19 NOTE — Progress Notes (Signed)
   12/19/14 1200  Clinical Encounter Type  Visited With Family;Health care provider  Visit Type Patient actively dying  Referral From Nurse  Spiritual Encounters  Spiritual Needs Prayer;Grief support;Emotional  CH responded to end of life consult; CH offered spiritual and grief support as well as prayer; family pastor, friend and pt family by bedside; pt extubation today; CH available for any additional support. 12:16 PM Erline Levine

## 2014-12-19 NOTE — Progress Notes (Signed)
Nutrition Brief Note  Chart reviewed. Pt now transitioning to comfort care.  No further nutrition interventions warranted at this time.  Please re-consult as needed.   Nikholas Geffre RD, LDN, CNSC [phone] Pager [phone] After Hours Pager    

## 2014-12-19 NOTE — Progress Notes (Signed)
Daily Progress Note   Patient Name: [name]       Date: 12/19/2014 DOB: [birth year]  Age: 54 y.o. MRN#: [id number] Attending Physician: Chesley Mires, MD Primary Care Physician: PROVIDER NOT IN SYSTEM Admit Date: 12/29/2014  Reason for Consultation/Follow-up: Establishing goals of care and Terminal care  Subjective: 54 year old female who sustained an out-of-hospital arrest. Upon EMS arrival patient had 6 minutes of CPR. It was suspected she aspirated in transit. She was intubated in the emergency room and underwent normothermia protocol. On 12/14/2014 patient sustained a bradycardic arrest with a mucous plug, status post bronc and placement of left chest tube. Patient began to exhibit myoclonus on 12/17/2014 and that has persisted into today. EEG reflects anoxic brain injury. Currently on fentanyl and propofol still with intermittent myoclonic jerks. Palliative consulted for goals of care.   Interval Events: I met with the patient's son and daughter as well as her pastor and other family friends this morning. Her family reports they are confident that she would not want to be sustained by artificial means including ventilator support. Her family reports that they are planning to withdraw care today.  A few minutes after I stepped in the room, her son reported that the family is ready proceed with extubation and withdrawal of care. I discussed with family possible paths forward after extubation including rapid decline and death immediately after withdrawal as well as possibility that she may continue to breathe on her own and maintain her life for some period of time. Regardless, the family reports that the main focus should be on her comfort. I assured them that one caring for her will continue to work to ensure that she remains comfortable.  Length of Stay: 7 days  Current Medications: Scheduled Meds:  . levETIRAcetam  1,000 mg Intravenous Q12H    Continuous Infusions: . sodium  chloride 10 mL/hr at 12/18/14 1920  . fentaNYL infusion INTRAVENOUS    . midazolam (VERSED) infusion    . propofol (DIPRIVAN) infusion 30 mcg/kg/min (12/19/14 0547)    PRN Meds: acetaminophen, atropine, fentaNYL, midazolam  Palliative Performance Scale: 10%     Vital Signs: BP 148/62 mmHg  Pulse 101  Temp(Src) 100.2 F (37.9 C) (Core (Comment))  Resp 16  Ht _0  (1.575 m)  Wt 63.3 kg (139 lb 8.8 oz)  BMI 25.52 kg/m2  SpO2 99%  LMP 06/26/2011 SpO2: SpO2: 99 % O2 Device: O2 Device: Ventilator O2 Flow Rate: O2 Flow Rate (L/min): 15 L/min  Intake/output summary:  Intake/Output Summary (Last 24 hours) at 12/19/14 0944 Last data filed at 12/19/14 0700  Gross per 24 hour  Intake 1065.28 ml  Output    869 ml  Net 196.28 ml   LBM:   Baseline Weight: Weight: 72.576 kg (160 lb) Most recent weight: Weight: 63.3 kg (139 lb 8.8 oz)  Physical Exam: General: no distress, briefly opens eyes with tactile stimulation, does not communicate or follow commands HEENT: ETT in place Cardiac: regular Chest: no wheeze, Lt chest tube in place Abd: soft Ext: no edema Neuro:intermittent myoclonic jerks  Additional Data Reviewed: Recent Labs     12/17/14  0205  12/18/14  0450  WBC  5.0  5.6  HGB  8.7*  7.3*  PLT  138*  158  NA  139  144  BUN  17  25*  CREATININE  0.97  0.93     Problem List:  Patient Active Problem List   Diagnosis Date Noted  . Palliative care encounter   .  DNR (do not resuscitate) 12/17/2014  . Spontaneous pneumothorax 12/15/2014  . Pneumothorax, left   . Pneumothorax on left   . Mucus plugging of bronchi 12/14/2014  . Cerebral edema   . Cardiac arrest due to respiratory disorder 12/09/2014  . Cardiac arrest 12/25/2014  . Respiratory arrest   . Chest pain with high risk for cardiac etiology 11/26/2011     Palliative Care Assessment & Plan    Code Status:  DNR  Goals of Care:  I met with the family again this morning. Her children are in  agreement that she would not want to continue to be sustained by artificial life support. Plan for withdrawal of ventilator support today.  Symptom Management: I discussed plan for withdrawal of ventilator support with Dr. Halford Chessman as well as bedside nursing staff. He is aware the family is ready to proceed with extubation and will place orders.  Pain: Currently on fentanyl continuous infusion at 100 g an hour. Anticipate need for up titration during extubation and postextubation. Would recommend continuing her continuous infusion, utilizing bolus dosing as needed for comfort, and continuing to titrate up her continuous infusion based on her PRN usage.   Dyspnea: Continue fentanyl infusion and plan to titrate during extubation and postextubation.   Anxiety/myoclonus: Currently on propofol. Would continue propofol, however may need to consider transition to benzodiazepine for use on the floor if she is stable enough to transition out of the ICU.  Secretions: Would recommend addition of atropine drops or Robinul IV as needed for excess secretions.  Please let us know if we can be of further assistance in regard to symptom management following withdrawal of ventilator support.  Psycho-social/Spiritual:  Desire for further Chaplaincy support: Her pastor is an bedside.   Prognosis: Hours - Days Discharge Planning: Likely terminal admission.   Care plan was discussed with the patient's family, Dr. Halford Chessman, and bedside nursing.  Thank you for allowing the Palliative Medicine Team to assist in the care of this patient.   Time In: 0920 Time Out: 0950 Total Time 30 Prolonged Time Billed  no     Greater than 50%  of this time was spent counseling and coordinating care related to the above assessment and plan.   Micheline Rough, MD  12/19/2014, 9:44 AM  Please contact Palliative Medicine Team phone at [phone] for questions and concerns.

## 2014-12-22 ENCOUNTER — Telehealth: Payer: Self-pay

## 2014-12-22 NOTE — Telephone Encounter (Signed)
On 2014/12/28 I received a death certificate from [facility]. The death certificate is for cremation. The patient is a patient of Doctor Sood. The death certificate will be taken to the pulmonary unit for signature today. On 12-28-14 I received the death certificate from Doctor Sood. I got the death certificate ready for pickup and called the funeral home to let them know it was ready. I faxed them a copy per their request.  On December 28, 2014 I also received the original death certificate and this copy will be taken to the pulmonary unit on Monday am for signature when Doctor Craige Cotta returns from vacation. On 01-01-15 I received the death certificate back from Doctor [place]. I got the death certificate ready for pickup and called the funeral home to let them know it was ready for pickup.

## 2015-01-07 NOTE — Care Management Important Message (Signed)
Important Message  Patient Details  Name: [name] MRN: [id number] Date of Birth: 1960-05-05   Medicare Important Message Given:  Yes-third notification given    Orson Aloe 12/10/2014, 8:41 AM

## 2015-01-07 NOTE — Progress Notes (Signed)
   Dec 21, 2014 1400  Clinical Encounter Type  Visited With Family  Visit Type Death  Referral From Nurse  Spiritual Encounters  Spiritual Needs Prayer;Emotional;Grief support  Chaplain stayed with family of Pt; Chaplain prayed for family; Chaplain listened and provide a presence for the grieving

## 2015-01-07 NOTE — Discharge Summary (Signed)
  [name] was a 54 y.o. female smoker admitted on Jan 01, 2015 with dyspnea.  She developed respiratory arrest leading to cardiac arrest.  She received approximately 6 minutes of CPR before ROSC.  She was intubated.  There was concern for aspiration, and she was started on antibiotics.  Cardiology was consulted.  She was placed on normothermia protocol.  She developed myoclonus.  Neurology was consulted.  EEG showed generalized cerebral dysfunction concerning for anoxic brain injury.  She had respiratory distress and was found to have left pneumothorax after developing mucus plug.  This lead to recurrent PEA cardiac arrest with 10 minutes of CPR before ROSC.  She had chest tube inserted and had bronchoscopy.  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  She required diprivan and keppra to control myoclonus.  Palliative care was consulted.  Decision was made for DNR status, and eventually transitioned to comfort care.  She was extubated on 9/12.  She expired on 01/05/2015 at 1407.  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

## 2015-01-07 NOTE — Progress Notes (Addendum)
Patient expired at 1407. No RR, HR confirmed by two RNs. CDS notified. MD notified. Chaplin paged to bedside. emotional support on going for family. Will prep eyes per CDS request,.

## 2015-01-07 NOTE — Progress Notes (Signed)
PCCM PROGRESS NOTE  [name] is a 54 y.o. female smoker admitted on 01/05/2015 with dyspnea.  She developed respiratory arrest leading to cardiac arrest.  She received approximately 6 minutes of CPR before ROSC.  She was intubated.  There was concern for aspiration, and she was started on antibiotics.  Cardiology was consulted.  She was placed on normothermia protocol.  She developed myoclonus.  Neurology was consulted.  EEG showed generalized cerebral dysfunction concerning for anoxic brain injury.  She had respiratory distress and was found to have left pneumothorax after developing mucus plug.  This lead to recurrent PEA cardiac arrest with 10 minutes of CPR before ROSC.  She had chest tube inserted and had bronchoscopy.  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  She required diprivan and keppra to control myoclonus.  Palliative care was consulted.  Decision was made for DNR status, and eventually transitioned to comfort care.  She was extubated on 9/12.  SUBJECTIVE: Appears comfortable on sedation.  OBJECTIVE: BP 127/54 mmHg  Pulse 112  Temp(Src) 100.8 F (38.2 C) (Core (Comment))  Resp 20  Ht  (1.575 m)  Wt 139 lb 8.8 oz (63.3 kg)  BMI 25.52 kg/m2  SpO2 44%  LMP 06/26/2011  General: no distress HEENT: no LAN Cardiac: regular Chest: no wheeze, Lt chest tube in place Abd: soft Ext: no edema Neuro: comatose  ASSESSMENT: Acute respiratory failure with respiratory arrest Cardiac arrest with PEA Hypertrophic cardiomyopathy Hx of COPD Aspiration pneumonitis Tobacco abuse Spontaneous Left pneumothorax Mucus plugging with atelectasis Hypokalemia Constipation Anemia of critical illness Thrombocytopenia of critical illness Hx of DM type II Peripheral artery disease Hx of HTN Hx of HLD Hx of CAD Colonization in blood culture from 12/12/14 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   Coralyn Helling, MD [facility] Pulmonary/Critical Care December 24, 2014, 9:37 AM Pager:  [phone] After 3pm call: [phone]

## 2015-01-07 NOTE — Progress Notes (Signed)
Daily Progress Note   Patient Name: [name]       Date: 2015/01/06 DOB: September 26, 1960  Age: 54 y.o. MRN#: [id number] Attending Physician: Coralyn Helling, MD Primary Care Physician: PROVIDER NOT IN SYSTEM Admit Date: 12/10/2014  Reason for Consultation/Follow-up: Establishing goals of care and Terminal care  Subjective: 54 year old female who sustained an out-of-hospital arrest. Upon EMS arrival patient had 6 minutes of CPR. It was suspected she aspirated in transit. She was intubated in the emergency room and underwent normothermia protocol. On 12/14/2014 patient sustained a bradycardic arrest with a mucous plug, status post bronc and placement of left chest tube. Patient began to exhibit myoclonus on 12/17/2014 and that has persisted into today. EEG reflects anoxic brain injury. Currently on fentanyl and versed. Palliative consulted for goals of care. Now extubated on comfort care.   Interval Events: Patient alone in room.  No distress.  Does not arouse during exam.    Length of Stay: 8 days  Current Medications: Scheduled Meds:  . levETIRAcetam  1,000 mg Intravenous Q12H    Continuous Infusions: . sodium chloride 10 mL/hr at 12/19/14 2000  . fentaNYL infusion INTRAVENOUS 150 mcg/hr (Jan 06, 2015 0310)  . midazolam (VERSED) infusion 7 mg/hr (2015/01/06 0310)  . propofol (DIPRIVAN) infusion Stopped (12/19/14 1130)    PRN Meds: acetaminophen, atropine, fentaNYL, midazolam  Palliative Performance Scale: 10%     Vital Signs: BP 127/54 mmHg  Pulse 114  Temp(Src) 100.8 F (38.2 C) (Core (Comment))  Resp 21  Ht 5\' 2"  (1.575 m)  Wt 63.3 kg (139 lb 8.8 oz)  BMI 25.52 kg/m2  SpO2 41%  LMP 06/26/2011 SpO2: SpO2: (!) 41 % O2 Device: O2 Device: Not Delivered O2 Flow Rate: O2 Flow Rate (L/min): 15 L/min  Intake/output summary:   Intake/Output Summary (Last 24 hours) at 01-06-15 0705 Last data filed at 01/06/2015 0500  Gross per 24 hour  Intake 895.78 ml  Output    550 ml  Net  345.78 ml   LBM:   Baseline Weight: Weight: 72.576 kg (160 lb) Most recent weight: Weight: 63.3 kg (139 lb 8.8 oz)  Physical Exam: General: no distress, does not open eyes with verbal or tactile stimulation,  Cardiac: regular Chest: no wheeze, shallow, regular, unlabored Abd: soft Ext: no edema, extremities warm, peripheral pulses diminished but present   Additional Data Reviewed: Recent Labs     12/18/14  0450  WBC  5.6  HGB  7.3*  PLT  158  NA  144  BUN  25*  CREATININE  0.93     Problem List:  Patient Active Problem List   Diagnosis Date Noted  . Dyspnea   . Palliative care encounter   . DNR (do not resuscitate) 12/17/2014  . Spontaneous pneumothorax 12/15/2014  . Pneumothorax, left   . Pneumothorax on left   . Mucus plugging of bronchi 12/14/2014  . Cerebral edema   . Cardiac arrest due to respiratory disorder 12/14/2014  . Cardiac arrest 12/13/2014  . Respiratory arrest   . Chest pain with high risk for cardiac etiology 11/26/2011     Palliative Care Assessment & Plan    Code Status:  DNR  Goals of Care:  Comfort care.  Patient was extubated yesterday.  Symptom Management: Patient resting comfortably this AM.  Pain: Currently on fentanyl continuous infusion at 150 g an hour.  Would recommend continuing her continuous infusion, utilizing bolus dosing as needed for comfort, and titrate up her continuous infusion as needed based on her  PRN usage.   Dyspnea: Continue fentanyl infusion and plan to titrate as needed to maintain comfort.   Anxiety/myoclonus: No notes of myoclonus on exam this morning. Currently on versed.   Secretions: Would recommend atropine drops or Robinul IV as needed for excess secretions.  Please let us know if we can be of further assistance in care of Ms. Witman  Prognosis: Hours - Days Discharge Planning: Likely terminal admission.  Thank you for allowing the Palliative Medicine Team to assist in the care of this  patient.   Time In: 0645 Time Out: 0705 Total Time 20 Prolonged Time Billed  no     Greater than 50%  of this time was spent counseling and coordinating care related to the above assessment and plan.   Romie Minus, MD  12-24-2014, 7:05 AM  Please contact Palliative Medicine Team phone at [phone] for questions and concerns.

## 2015-01-07 NOTE — Progress Notes (Signed)
CSW contacted by other departmental CSW that family was requesting to meet about cremation information. CSW and BSW Intern met with daughter Anderson Malta and briefly with son Molli Barrows to discuss possible resources for cremation.  Son left the meeting early; was upset about another family issue as well as the passing of his mother.  Daughter was provided with a list of area funeral homes and discussed possible options for financial assistance contacting her mother's church, searching for possible life insurance policies and also to contact area funeral homes to determine if possible assistance could be provided.  Patient's daughter-in-law stated that in the past that a local funeral home had assisted with cremation of another family member; thus- they will contact this contact facility first. Daughter was appreciative of information provided and will continue to work with nursing once funeral home arrangements have been completed.  CSW signing off.  Lorie Phenix. Pauline Good, [place]

## 2015-01-07 DEATH — deceased

## 2016-02-06 LAB — BLOOD GAS, ARTERIAL
Acid-Base Excess: 6.4 mmol/L — ABNORMAL HIGH (ref 0.0–2.0)
Bicarbonate: 18.1 mEq/L — ABNORMAL LOW (ref 20.0–24.0)
DRAWN BY: 283401
FIO2: 100
LHR: 16 {breaths}/min
O2 Saturation: 98.5 %
PATIENT TEMPERATURE: 97.2
PEEP: 5 cmH2O
PH ART: 7.195 — AB (ref 7.350–7.450)
TCO2: 18.6 mmol/L (ref 0–100)
VT: 420 mL
pCO2 arterial: 54.8 mmHg — ABNORMAL HIGH (ref 35.0–45.0)
pO2, Arterial: 172 mmHg — ABNORMAL HIGH (ref 80.0–100.0)

## 2017-02-17 IMAGING — CT CT HEAD W/O CM
1 of 2 series · 16 of 30 positions shown, 20 images · non-contrast
Comparison: 12/13/2014

CLINICAL DATA: Altered mental status post arrest

EXAM:
CT HEAD WITHOUT CONTRAST
TECHNIQUE: Contiguous axial images were obtained from the base of the skull
through the vertex without intravenous contrast.

[Series 3: head 5.0 h30s · axial · 0.41mm/px · z∈[-153,-8]mm · 16 of 33 slices shown, 20 images]
[im 2/33  brain]
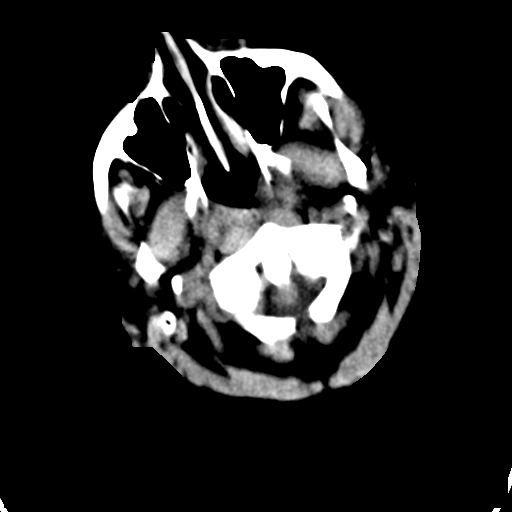
[im 2/33  bone]
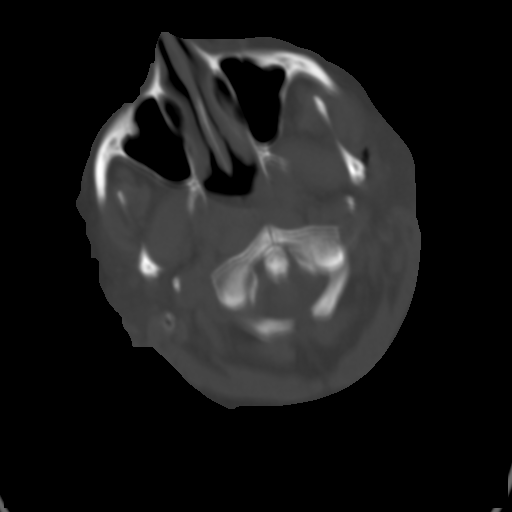
[im 4/33  brain]
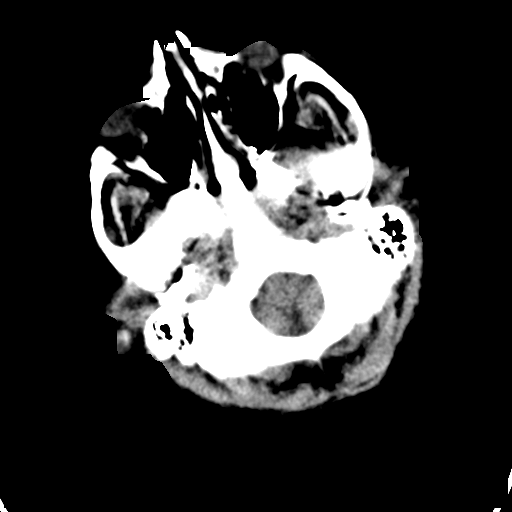
[im 5/33  brain]
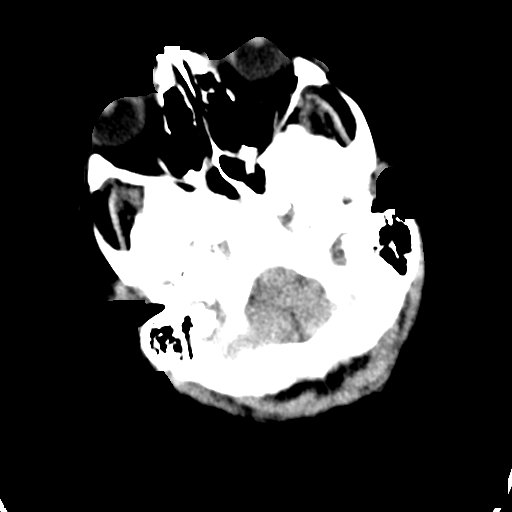
[im 9/33  brain]
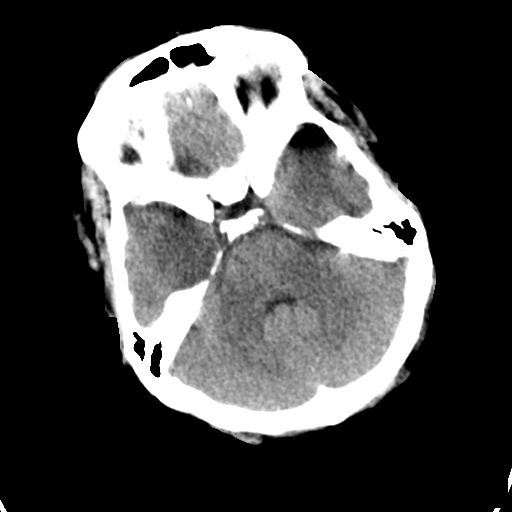
[im 10/33  brain]
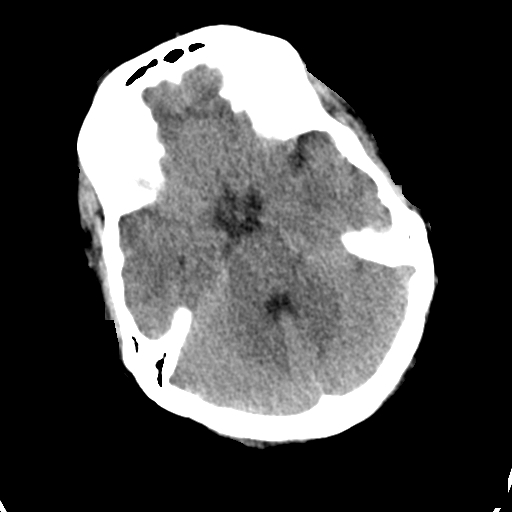
[im 10/33  bone]
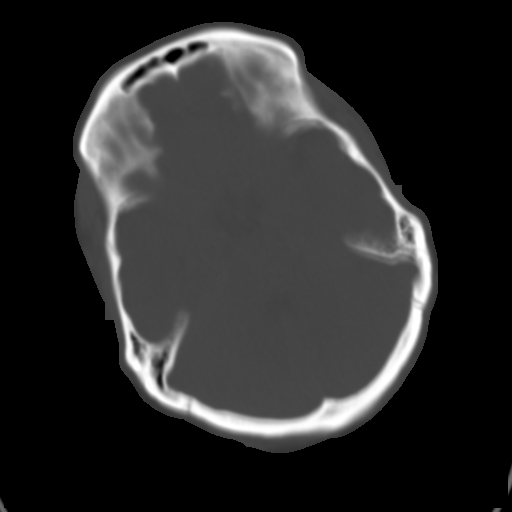
[im 12/33  brain]
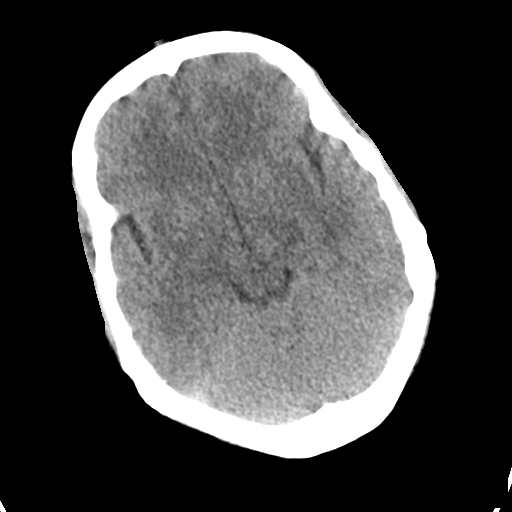
[im 13/33  brain]
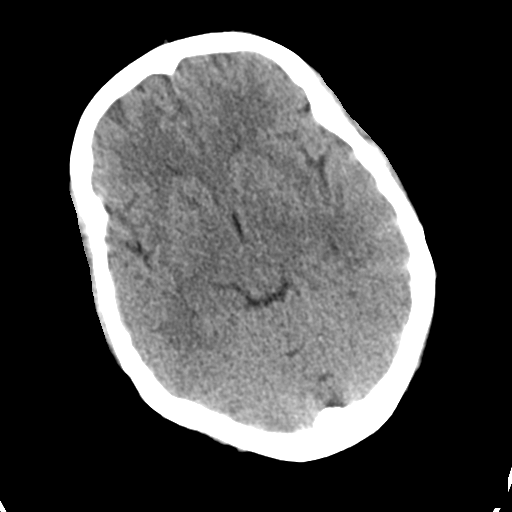
[im 15/33  brain]
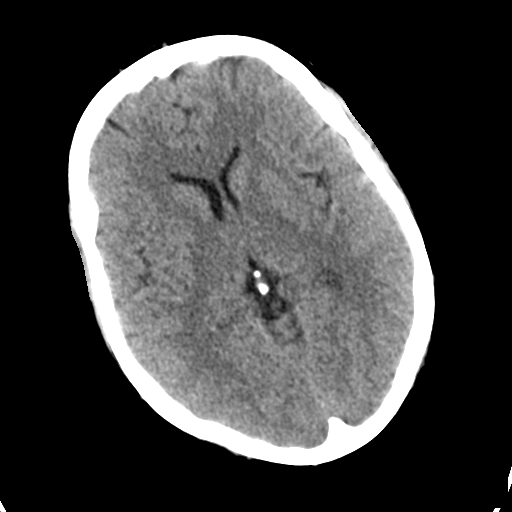
[im 18/33  brain]
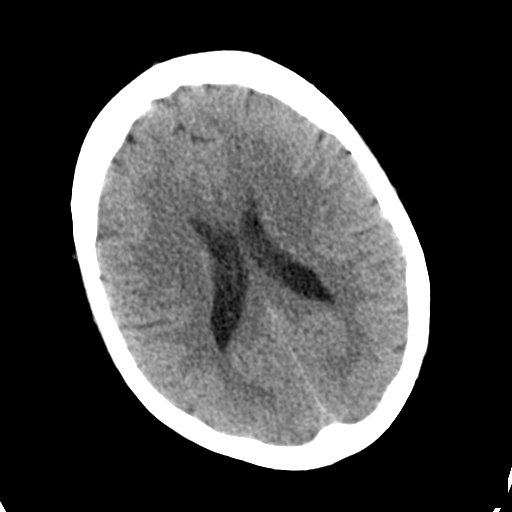
[im 18/33  bone]
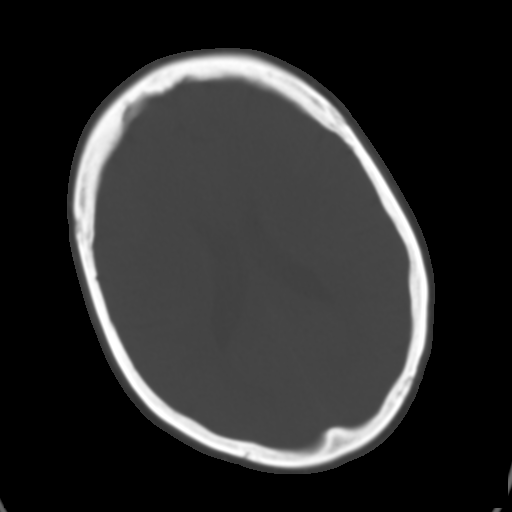
[im 20/33  brain]
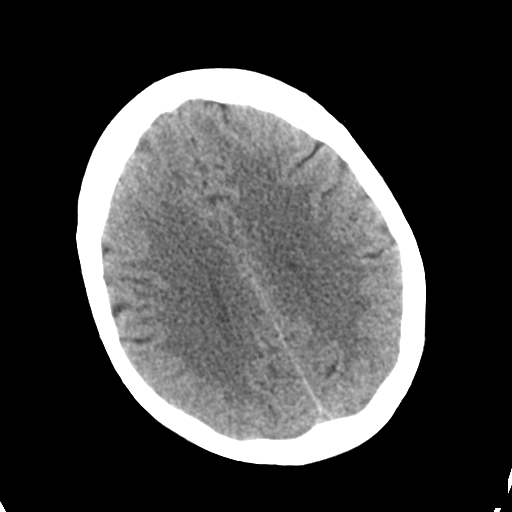
[im 21/33  brain]
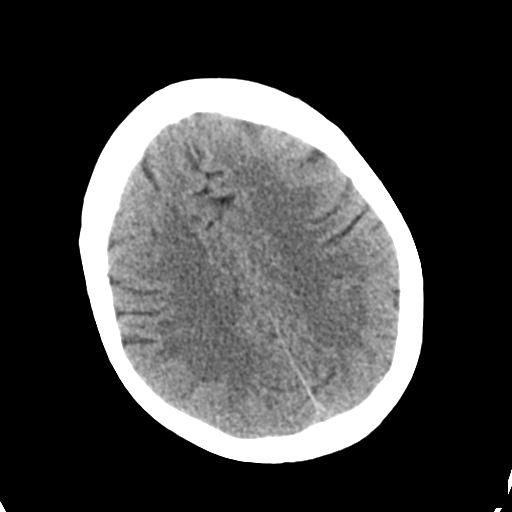
[im 23/33  brain]
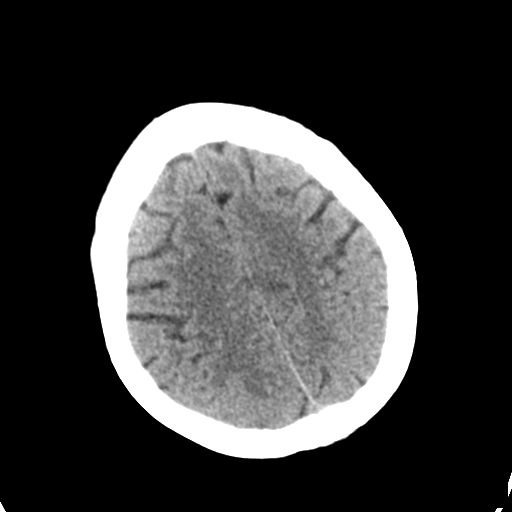
[im 25/33  brain]
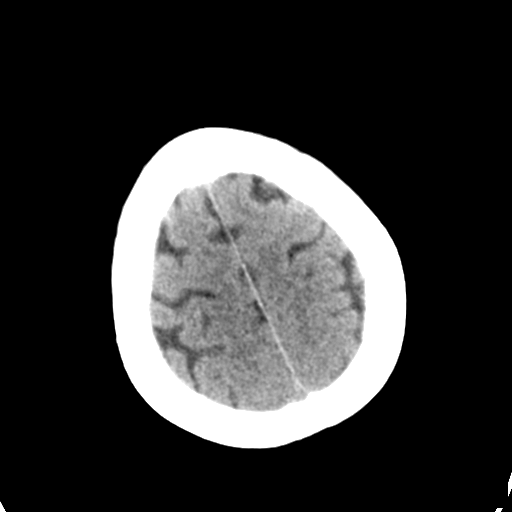
[im 25/33  bone]
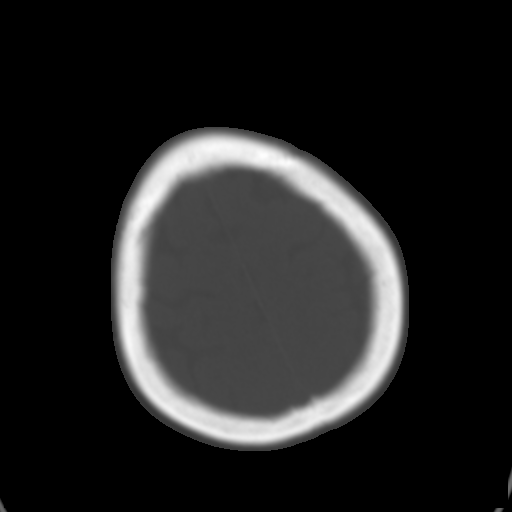
[im 28/33  brain]
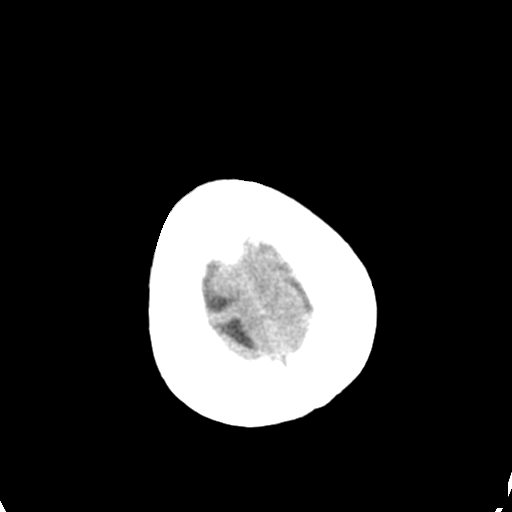
[im 29/33  brain]
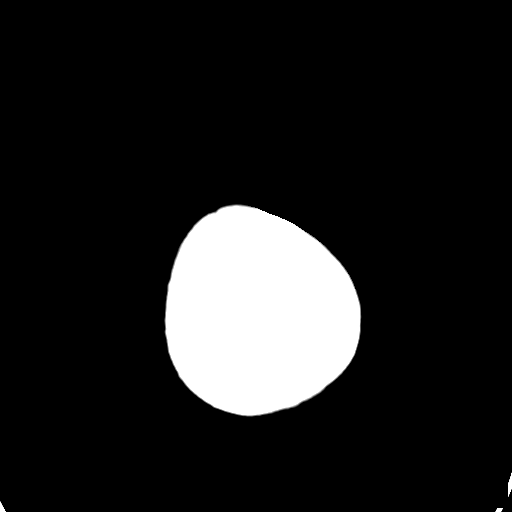
[im 31/33  brain]
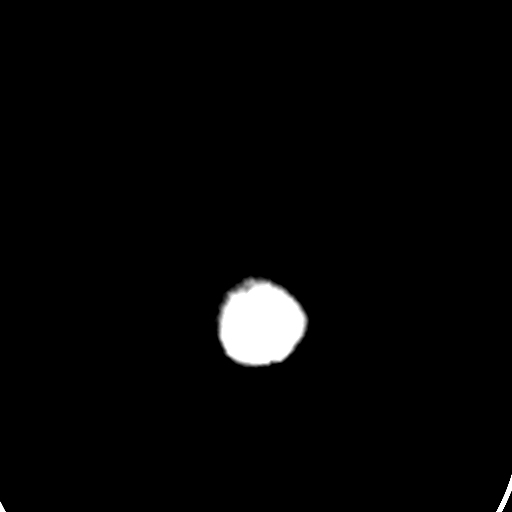

[16 of 30 positions shown; findings below may reference images not displayed]

FINDINGS: Images are motion degraded. Mild sinusitis predominantly in the
sphenoid sinuses. No acute hemorrhage, infarct, or mass lesion is
identified. No midline shift. Subjectively, there is minimal
relative hyperdensity and loss of sulcation involving the cerebellum
but this finding is stable compared to previously and is felt to
most likely be technical. No midline shift. No ventriculomegaly.
Orbits are unremarkable. No skull fracture.
IMPRESSION: No evidence for acute intracranial finding.

## 2017-02-19 IMAGING — CR DG CHEST 1V PORT
1 series · 1 of 1 positions shown · non-contrast
Comparison: Chest CT 12/16/2014, chest x-ray 12/16/2014, 12/15/2014

CLINICAL DATA: 54-year-old female with a history of pneumothorax.

EXAM:
PORTABLE CHEST - 1 VIEW

[AP]
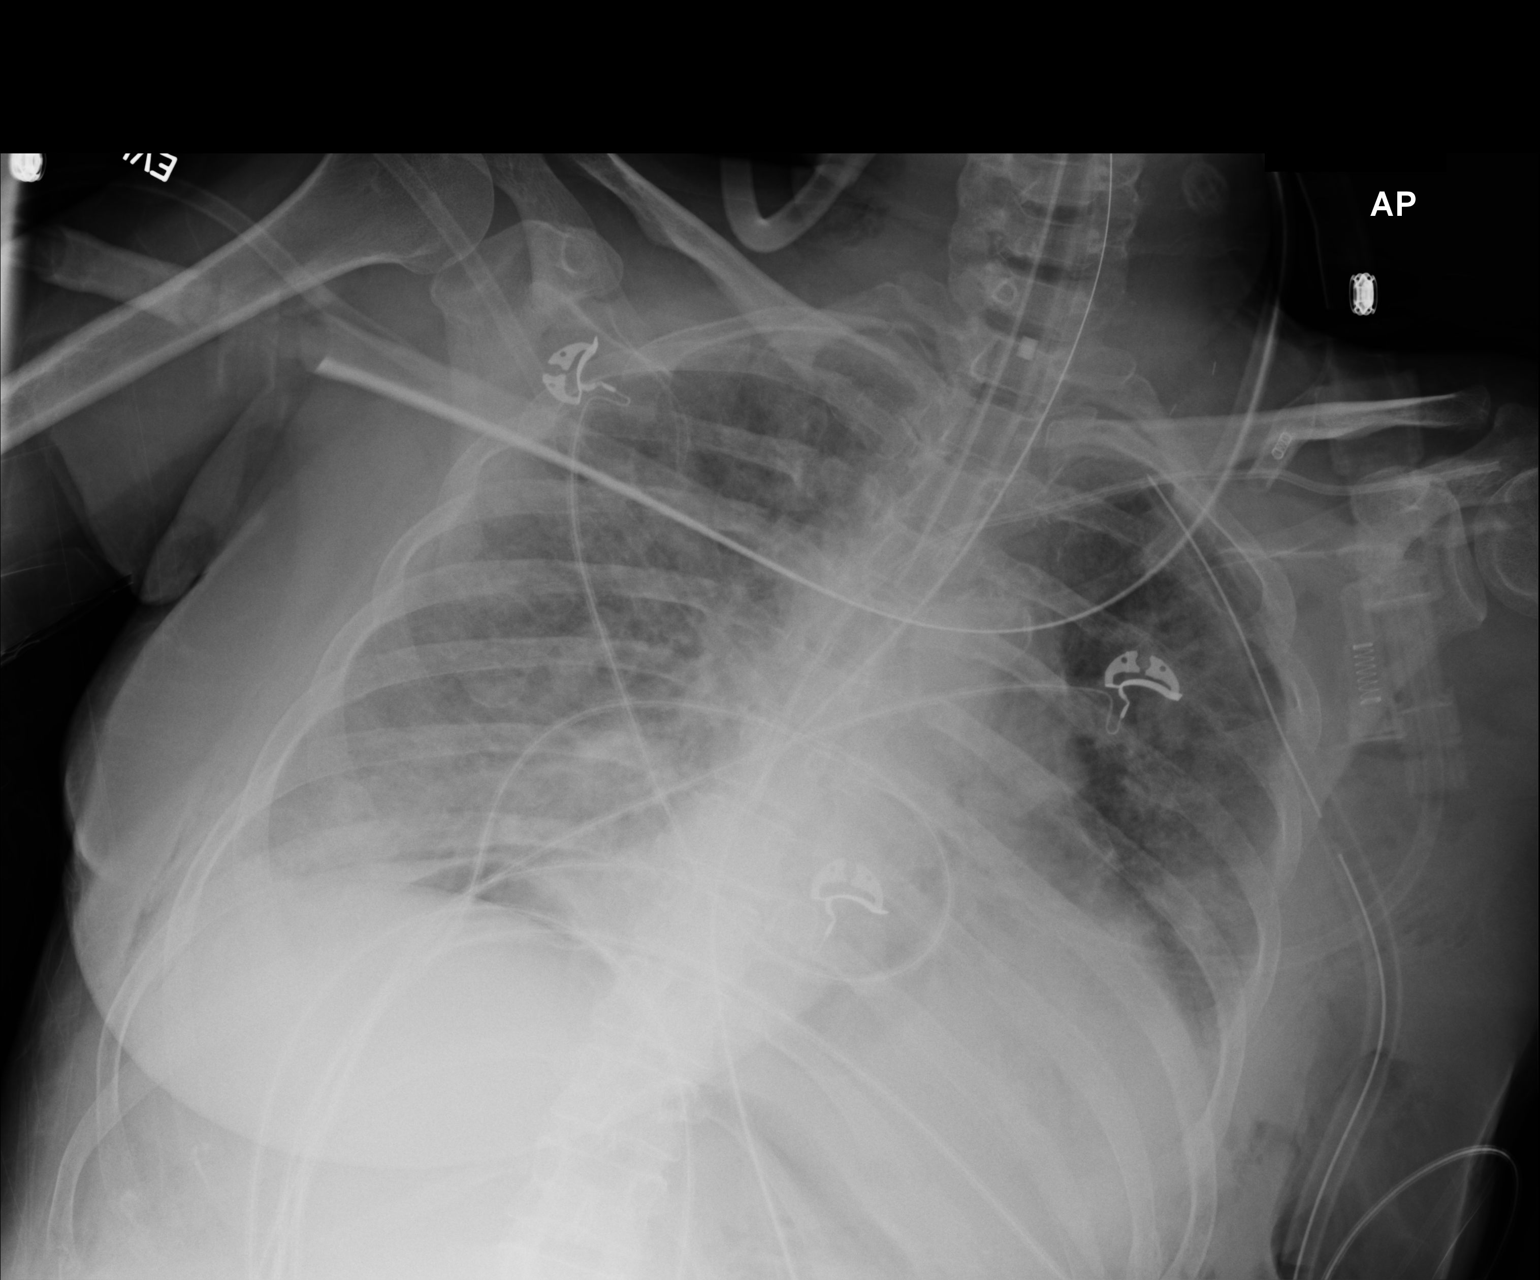

[1 of 1 positions shown; findings below may reference images not displayed]

FINDINGS: Re- demonstration of cardiomegaly. Atherosclerotic calcifications of
the aortic arch. No definite evidence of pneumopericardium.

Unchanged endotracheal tube, terminating 2 cm above the carina.

Unchanged left-sided thoracostomy tube. Note the side port is
lateral to the pleural surface. No left-sided pneumothorax is
visualized.

Unchanged position of left subclavian central catheter, appearing to
terminate superior vena cava.

Similar appearance of bilateral mixed interstitial and airspace
disease.

Subcutaneous gas is more evident on the left chest. Resolving
right-sided subcutaneous gas.

Evidence of persisting pneumoperitoneum.
IMPRESSION: Unchanged left-sided thoracostomy tube. Note that the side-port is
lateral to the ribs, outside of the thoracic cavity. No pneumothorax
is visualized.

Similar appearance of mixed interstitial and airspace disease.

Unchanged endotracheal tube and left subclavian central catheter.

Persisting pneumoperitoneum.
